# Patient Record
Sex: Female | Born: 1995
Health system: Southern US, Community
[De-identification: ages and names within clinical notes are randomized; demographics above are authoritative.]

## PROBLEM LIST (undated history)

## (undated) DIAGNOSIS — R011 Cardiac murmur, unspecified: Secondary | ICD-10-CM

## (undated) DIAGNOSIS — L709 Acne, unspecified: Secondary | ICD-10-CM

## (undated) DIAGNOSIS — D649 Anemia, unspecified: Secondary | ICD-10-CM

## (undated) HISTORY — PX: WISDOM TOOTH EXTRACTION: SHX21

## (undated) HISTORY — DX: Acne, unspecified: L70.9

## (undated) HISTORY — DX: Cardiac murmur, unspecified: R01.1

## (undated) HISTORY — PX: TONSILLECTOMY: SUR1361

---
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
## 2013-02-04 ENCOUNTER — Ambulatory Visit: Payer: Self-pay | Admitting: Obstetrics and Gynecology

## 2013-02-05 ENCOUNTER — Ambulatory Visit (INDEPENDENT_AMBULATORY_CARE_PROVIDER_SITE_OTHER): Payer: BC Managed Care – PPO | Admitting: Obstetrics & Gynecology

## 2013-02-05 ENCOUNTER — Encounter: Payer: Self-pay | Admitting: Obstetrics & Gynecology

## 2013-02-05 VITALS — BP 117/79 | HR 66 | Ht 67.0 in | Wt 123.0 lb

## 2013-02-05 DIAGNOSIS — Z113 Encounter for screening for infections with a predominantly sexual mode of transmission: Secondary | ICD-10-CM

## 2013-02-05 DIAGNOSIS — Z01419 Encounter for gynecological examination (general) (routine) without abnormal findings: Secondary | ICD-10-CM

## 2013-02-05 DIAGNOSIS — Z0000 Encounter for general adult medical examination without abnormal findings: Secondary | ICD-10-CM

## 2013-02-05 DIAGNOSIS — R011 Cardiac murmur, unspecified: Secondary | ICD-10-CM

## 2013-02-05 NOTE — Progress Notes (Signed)
Subjective:    Emily Flowers is a 17 y.o. female who presents for an annual exam. She needs her PE form filled out for [employer] where she will start in August.  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 The patient is not currently sexually active. She is no longer with this guy. GYN screening history: no prior history of gyn screening tests. The patient wears seatbelts: yes. The patient participates in regular exercise: yes. Has the patient ever been transfused or tattooed?: no. The patient reports that there is not domestic violence in her life.   Menstrual History: OB History   Grav Para Term Preterm Abortions TAB SAB Ect Mult Living                  Menarche age: 74  Patient's last menstrual period was 01/12/2013.    The following portions of the patient's history were reviewed and updated as appropriate: allergies, current medications, past family history, past medical history, past social history, past surgical history and problem list.  Review of Systems Pertinent items are noted in HPI.  Coitarche at 13, partners- 1. Condoms for birth control. Her mom does want her to take the OCPs for her acne. She is unsure why. She says that she had the Gardasil series at [facility].   Objective:    BP 117/79  Pulse 66  Ht 5\' 7"  (1.702 m)  Wt 123 lb (55.792 kg)  BMI 19.26 kg/m2  LMP 01/12/2013  General Appearance:    Alert, cooperative, no distress, appears stated age  Head:    Normocephalic, without obvious abnormality, atraumatic  Eyes:    PERRL, conjunctiva/corneas clear, EOM's intact, fundi    benign, both eyes  Ears:    Normal TM's and external ear canals, both ears  Nose:   Nares normal, septum midline, mucosa normal, no drainage    or sinus tenderness  Throat:   Lips, mucosa, and tongue normal; teeth and gums normal  Neck:   Supple, symmetrical, trachea midline, no adenopathy;    thyroid:  no enlargement/tenderness/nodules; no  carotid   bruit or JVD  Back:     Symmetric, no curvature, ROM normal, no CVA tenderness  Lungs:     Clear to auscultation bilaterally, respirations unlabored  Chest Wall:    No tenderness or deformity   Heart:    Regular rate and rhythm, S1 and S2 normal, 3/6 SEM (She does report occasions of extra fast heart rate/anxiety)  Breast Exam:    No tenderness, masses, or nipple abnormality  Abdomen:     Soft, non-tender, bowel sounds active all four quadrants,    no masses, no organomegaly  Genitalia:    Normal female without lesion, discharge or tenderness, shaved, bimanual deferred due to patient preference     Extremities:   Extremities normal, atraumatic, no cyanosis or edema  Pulses:   2+ and symmetric all extremities  Skin:   Skin color, texture, turgor normal, no rashes or lesions  Lymph nodes:   Cervical, supraclavicular, and axillary nodes normal  Neurologic:   CNII-XII intact, normal strength, sensation and reflexes    throughout  .    Assessment:    Healthy female exam.  New murmur- refer to Cards   Plan:     Breast self exam technique reviewed and patient encouraged to perform self-exam monthly.

## 2013-02-06 LAB — GC/CHLAMYDIA PROBE AMP
CT Probe RNA: NEGATIVE
GC Probe RNA: NEGATIVE

## 2013-04-10 ENCOUNTER — Institutional Professional Consult (permissible substitution): Payer: Self-pay | Admitting: Cardiovascular Disease

## 2013-04-17 ENCOUNTER — Encounter: Payer: Self-pay | Admitting: Cardiovascular Disease

## 2013-08-26 ENCOUNTER — Ambulatory Visit (INDEPENDENT_AMBULATORY_CARE_PROVIDER_SITE_OTHER): Payer: BC Managed Care – PPO | Admitting: *Deleted

## 2013-08-26 DIAGNOSIS — Z23 Encounter for immunization: Secondary | ICD-10-CM

## 2014-02-02 ENCOUNTER — Ambulatory Visit (INDEPENDENT_AMBULATORY_CARE_PROVIDER_SITE_OTHER): Payer: BC Managed Care – PPO | Admitting: Obstetrics & Gynecology

## 2014-02-02 ENCOUNTER — Encounter: Payer: Self-pay | Admitting: Obstetrics & Gynecology

## 2014-02-02 VITALS — BP 117/76 | HR 59 | Ht 66.0 in | Wt 132.0 lb

## 2014-02-02 DIAGNOSIS — Z309 Encounter for contraceptive management, unspecified: Secondary | ICD-10-CM

## 2014-02-02 DIAGNOSIS — IMO0001 Reserved for inherently not codable concepts without codable children: Secondary | ICD-10-CM

## 2014-02-02 DIAGNOSIS — N946 Dysmenorrhea, unspecified: Secondary | ICD-10-CM

## 2014-02-02 MED ORDER — LEVONORGESTREL-ETHINYL ESTRAD 0.1-20 MG-MCG PO TABS: 1.0000 | ORAL_TABLET | Freq: Every day | ORAL | Status: DC

## 2014-02-02 NOTE — Progress Notes (Signed)
   Subjective:    Patient ID: Emily Flowers, female    DOB: 1996-02-10, 18 y.o.   MRN: [id number]  HPI  18 yo AA G0 here with the desire to start OCPs for management of her acne and periods which are becoming heavier and more painful. She has been abstinent for the last 4 month.  Review of Systems She is a sophomore at [employer] She is on a scholarship for running at [employer]    Objective:   Physical Exam        Assessment & Plan:  Contraception/acne/menstrual regulation- start levlite Start today and use back up for a month prn Rec condoms at all times for STI protection

## 2014-08-16 ENCOUNTER — Ambulatory Visit (INDEPENDENT_AMBULATORY_CARE_PROVIDER_SITE_OTHER): Payer: BC Managed Care – PPO | Admitting: Obstetrics and Gynecology

## 2014-08-16 ENCOUNTER — Encounter: Payer: Self-pay | Admitting: Obstetrics and Gynecology

## 2014-08-16 VITALS — BP 129/82 | HR 70 | Wt 140.0 lb

## 2014-08-16 DIAGNOSIS — Z113 Encounter for screening for infections with a predominantly sexual mode of transmission: Secondary | ICD-10-CM

## 2014-08-16 DIAGNOSIS — N760 Acute vaginitis: Secondary | ICD-10-CM | POA: Diagnosis not present

## 2014-08-16 NOTE — Progress Notes (Signed)
Patient ID: Emily Flowers, female   DOB: October 17, 1995, 18 y.o.   MRN: [id number] 18 yo G0 here for the evaluation of a non-pruritic vaginal discharge which has been present for the past month. Patient reports being treated for a UTI a month ago. Patient is sexually active using condoms for STD prevention and OCP for contraception.  Past Medical History  Diagnosis Date  . Acne    History reviewed. No pertinent past surgical history. Family History  Problem Relation Age of Onset  . Diabetes Father   . Cancer Maternal Aunt     breast  . Diabetes Paternal Aunt   . Diabetes Paternal Uncle    History  Substance Use Topics  . Smoking status: Never Smoker   . Smokeless tobacco: Never Used  . Alcohol Use: No   GENERAL: Well-developed, well-nourished female in no acute distress.  ABDOMEN: Soft, nontender, nondistended. No organomegaly. PELVIC: Normal external female genitalia. Vagina is pink and rugated.  Normal discharge. Normal appearing cervix. Uterus is normal in size. No adnexal mass or tenderness. EXTREMITIES: No cyanosis, clubbing, or edema, 2+ distal pulses.  A/P 18 yo here for the evaluation of vaginitis - wet prep collected Gc/Cl cultures also collected - patient will be contacted with any abnormal results - rtc prn

## 2014-08-17 LAB — WET PREP, GENITAL
Clue Cells Wet Prep HPF POC: NONE SEEN
TRICH WET PREP: NONE SEEN
WBC WET PREP: NONE SEEN

## 2014-08-17 LAB — GC/CHLAMYDIA PROBE AMP
CT PROBE, AMP APTIMA: POSITIVE — AB
GC PROBE AMP APTIMA: NEGATIVE

## 2014-08-18 ENCOUNTER — Encounter: Payer: Self-pay | Admitting: *Deleted

## 2014-08-18 ENCOUNTER — Telehealth: Payer: Self-pay | Admitting: *Deleted

## 2014-08-18 DIAGNOSIS — A749 Chlamydial infection, unspecified: Secondary | ICD-10-CM

## 2014-08-18 DIAGNOSIS — B379 Candidiasis, unspecified: Secondary | ICD-10-CM

## 2014-08-18 MED ORDER — FLUCONAZOLE 150 MG PO TABS: 150.0000 mg | ORAL_TABLET | Freq: Once | ORAL | Status: DC

## 2014-08-18 MED ORDER — AZITHROMYCIN 1 G PO PACK: 1.0000 g | PACK | Freq: Once | ORAL | Status: DC

## 2014-08-18 NOTE — Telephone Encounter (Signed)
Patient is notified of test results and she will notify her partner and pick up her medication.

## 2014-08-18 NOTE — Telephone Encounter (Signed)
-----   Message from Mora Bellman, MD sent at 08/17/2014  2:03 PM EST ----- Please inform patient of positive yeast and chlamydia infection. Please call in prescription for Diflucan 150 mg po for 1 dose and Azithromycin 1000 mg po for 1 dose  Her partner should also be informed and treated for the chlamydia infection  Thanks  Peggy

## 2014-12-02 ENCOUNTER — Other Ambulatory Visit: Payer: Self-pay | Admitting: Obstetrics & Gynecology

## 2015-01-19 ENCOUNTER — Ambulatory Visit (INDEPENDENT_AMBULATORY_CARE_PROVIDER_SITE_OTHER): Payer: Self-pay | Admitting: Obstetrics & Gynecology

## 2015-01-19 ENCOUNTER — Encounter: Payer: Self-pay | Admitting: Obstetrics & Gynecology

## 2015-01-19 VITALS — BP 125/86 | HR 71 | Ht 66.0 in | Wt 137.8 lb

## 2015-01-19 DIAGNOSIS — Z30013 Encounter for initial prescription of injectable contraceptive: Secondary | ICD-10-CM

## 2015-01-19 DIAGNOSIS — N898 Other specified noninflammatory disorders of vagina: Secondary | ICD-10-CM

## 2015-01-19 MED ORDER — MISOPROSTOL 200 MCG PO TABS: ORAL_TABLET | ORAL | Status: DC

## 2015-01-19 MED ORDER — FLUCONAZOLE 150 MG PO TABS: 150.0000 mg | ORAL_TABLET | Freq: Once | ORAL | Status: DC

## 2015-01-19 MED ORDER — MEDROXYPROGESTERONE ACETATE 150 MG/ML IM SUSP
150.0000 mg | Freq: Once | INTRAMUSCULAR | Status: AC
  Administered 2015-01-19: 150 mg via INTRAMUSCULAR

## 2015-01-19 NOTE — Progress Notes (Signed)
Patient took two courses of antibiotic and now she feels vaginal irritation.  She is also wanting to discuss changing birth control as she is having trouble remembering to take the pill every day.

## 2015-01-19 NOTE — Progress Notes (Signed)
   Subjective:    Patient ID: Emily Flowers, female    DOB: 10/07/1995, 19 y.o.   MRN: [id number]  HPI  19 yo S AA G0 is here with the complaint of a month h/o vaginal discharge and vaginal itching. She thinks that this started after taking a zpack for a sinus infection last month. Of note, she and boyfriend were treated for chlamydia 12/15. No TOC yet.  She is forgetting to take her OCPs and would like to try Skyla   Review of Systems She is a Furniture conservator/restorer at [employer]. She says that she has had all 3 Gardasil shots.    Objective:   Physical Exam [id number] abd- benign EG-normal Vaginal with some thickish white discharge, possibly yeast. Otherwise all is normal.       Assessment & Plan:  Contraception- depo provera today and RTC when we have Skyla in stock (cytotec the night before insertion) Vaginal itch- check cervical cultures and wet prep. Treat with diflucan now.

## 2015-01-20 LAB — WET PREP, GENITAL
Clue Cells Wet Prep HPF POC: NONE SEEN
Trich, Wet Prep: NONE SEEN

## 2015-01-21 LAB — GC/CHLAMYDIA PROBE AMP
CT PROBE, AMP APTIMA: NEGATIVE
GC PROBE AMP APTIMA: NEGATIVE

## 2015-03-30 ENCOUNTER — Emergency Department (INDEPENDENT_AMBULATORY_CARE_PROVIDER_SITE_OTHER): Payer: BLUE CROSS/BLUE SHIELD

## 2015-03-30 ENCOUNTER — Encounter (HOSPITAL_COMMUNITY): Payer: Self-pay | Admitting: *Deleted

## 2015-03-30 ENCOUNTER — Emergency Department (INDEPENDENT_AMBULATORY_CARE_PROVIDER_SITE_OTHER)
Admission: EM | Admit: 2015-03-30 | Discharge: 2015-03-30 | Disposition: A | Discharge status: Home / Self Care | Payer: BLUE CROSS/BLUE SHIELD | Source: Home / Self Care | Attending: Family Medicine | Admitting: Family Medicine

## 2015-03-30 DIAGNOSIS — J069 Acute upper respiratory infection, unspecified: Secondary | ICD-10-CM | POA: Diagnosis not present

## 2015-03-30 MED ORDER — IPRATROPIUM BROMIDE 0.06 % NA SOLN: 2.0000 | Freq: Four times a day (QID) | NASAL | Status: DC

## 2015-03-30 NOTE — ED Provider Notes (Addendum)
CSN: [id number]     Arrival date & time 03/30/15  1300 History   First MD Initiated Contact with Patient 03/30/15 1338     Chief Complaint  Patient presents with  . Chest Pain   (Consider location/radiation/quality/duration/timing/severity/associated sxs/prior Treatment) Patient is a 19 y.o. female presenting with URI. The history is provided by the patient.  URI Presenting symptoms: congestion, cough and rhinorrhea   Presenting symptoms: no fever and no sore throat   Severity:  Mild Onset quality:  Gradual Duration:  1 week Progression:  Unchanged Chronicity:  New Ineffective treatments:  Prescription medications Associated symptoms: no sneezing and no swollen glands   Risk factors: no sick contacts     Past Medical History  Diagnosis Date  . Acne    History reviewed. No pertinent past surgical history. Family History  Problem Relation Age of Onset  . Diabetes Father   . Cancer Maternal Aunt     breast  . Diabetes Paternal Aunt   . Diabetes Paternal Uncle    History  Substance Use Topics  . Smoking status: Never Smoker   . Smokeless tobacco: Never Used  . Alcohol Use: No   OB History    No data available     Review of Systems  Constitutional: Negative.  Negative for fever.  HENT: Positive for congestion, postnasal drip and rhinorrhea. Negative for sneezing and sore throat.   Respiratory: Positive for cough.   Cardiovascular: Negative.   Gastrointestinal: Negative.   Skin: Negative.     Allergies  Banana; Penicillins; and Sulfa antibiotics  Home Medications   Prior to Admission medications   Medication Sig Start Date End Date Taking? Authorizing Provider  cetirizine (ZYRTEC) 10 MG tablet Take 10 mg by mouth daily.    Historical Provider, MD  fluconazole (DIFLUCAN) 150 MG tablet Take 1 tablet (150 mg total) by mouth once. 01/19/15   Emily Filbert, MD  ipratropium (ATROVENT) 0.06 % nasal spray Place 2 sprays into both nostrils 4 (four) times daily. 03/30/15    Billy Fischer, MD  misoprostol (CYTOTEC) 200 MCG tablet Take 3 pills by mouth the night before biopsy. 01/19/15   Emily Filbert, MD  Multiple Vitamins-Minerals (MULTIVITAMIN WITH MINERALS) tablet Take 1 tablet by mouth daily.    Historical Provider, MD  ORSYTHIA 0.1-20 MG-MCG tablet TAKE 1 TABLET BY MOUTH DAILY. 12/06/14   Emily Filbert, MD   BP 123/84 mmHg  Pulse 82  Temp(Src) 98.5 F (36.9 C) (Oral)  Resp 16  SpO2 97% Physical Exam  Constitutional: She is oriented to person, place, and time. She appears well-developed and well-nourished. No distress.  HENT:  Head: Normocephalic.  Right Ear: External ear normal.  Left Ear: External ear normal.  Mouth/Throat: Oropharynx is clear and moist.  Eyes: Conjunctivae are normal. Pupils are equal, round, and reactive to light.  Neck: Normal range of motion. Neck supple.  Cardiovascular: Normal heart sounds.   Pulmonary/Chest: Breath sounds normal.  Lymphadenopathy:    She has no cervical adenopathy.  Neurological: She is alert and oriented to person, place, and time.  Skin: Skin is warm and dry.  Nursing note and vitals reviewed.   ED Course  Procedures (including critical care time) Labs Review Labs Reviewed - No data to display  Imaging Review Dg Chest 2 View  03/30/2015   CLINICAL DATA:  Two day history of chest pain  EXAM: CHEST  2 VIEW  COMPARISON:  None.  FINDINGS: Lungs are clear. Heart size and  pulmonary vascularity are normal. No adenopathy. No pneumothorax. No bone lesions.  IMPRESSION: No abnormality noted.   Electronically Signed   By: Lowella Grip III M.D.   On: 03/30/2015 14:13   X-rays reviewed and report per radiologist.   MDM   1. URI (upper respiratory infection)   pt stated that mother requesting cxr to make sure, after pt was d/c'd. cxr neg.     Billy Fischer, MD 03/30/15 1347  Billy Fischer, MD 03/30/15 [phone]

## 2015-03-30 NOTE — ED Notes (Signed)
Pt  Reports        Chest  Pain  And     Upper  Back  Pain   Worse   When  She  Takes a  Deep breath      Symptoms  Began  Yesterday         denys  Any  specefic injury           ambualted  To  Room  With a  Steady fluid  Gait

## 2015-03-30 NOTE — Discharge Instructions (Signed)
Drink plenty of fluids as discussed, use medicine as prescribed, and mucinex or delsym for cough. Return or see your doctor if further problems °

## 2015-03-31 ENCOUNTER — Other Ambulatory Visit: Payer: Self-pay

## 2015-04-05 ENCOUNTER — Ambulatory Visit (INDEPENDENT_AMBULATORY_CARE_PROVIDER_SITE_OTHER): Payer: BLUE CROSS/BLUE SHIELD | Admitting: Family Medicine

## 2015-04-05 ENCOUNTER — Encounter: Payer: Self-pay | Admitting: Family Medicine

## 2015-04-05 VITALS — BP 90/60 | HR 80 | Temp 98.6°F | Resp 18 | Wt 129.0 lb

## 2015-04-05 DIAGNOSIS — R634 Abnormal weight loss: Secondary | ICD-10-CM | POA: Diagnosis not present

## 2015-04-05 DIAGNOSIS — R5383 Other fatigue: Secondary | ICD-10-CM | POA: Diagnosis not present

## 2015-04-05 DIAGNOSIS — J302 Other seasonal allergic rhinitis: Secondary | ICD-10-CM

## 2015-04-05 DIAGNOSIS — J069 Acute upper respiratory infection, unspecified: Secondary | ICD-10-CM | POA: Diagnosis not present

## 2015-04-05 NOTE — Progress Notes (Signed)
Patient: Emily Flowers Female    DOB: 1996-08-20   19 y.o.   MRN: [id number] Visit Date: 04/05/2015  Today's Provider: Vernie Murders, PA   Chief Complaint  Patient presents with  . Establish Care  . Sinusitis   Subjective:    Sinusitis This is a recurrent problem. The current episode started in the past 7 days. The problem has been gradually improving since onset. There has been no fever. Her pain is at a severity of 4/10. The pain is mild. Associated symptoms include congestion, coughing (on and off), a hoarse voice, sinus pressure, sneezing and swollen glands. Pertinent negatives include no ear pain, headaches or shortness of breath. (Patient feels throat is scratchy) Past treatments include nothing.  Patient was seen at the [facility] and was diagnosed with URI and also had an X-ray done due to patient complaining of chest pain. X-ray were Normal. Patient discharge instructions were to drink plenty of fluids and use medicine as prescribed, and Mucinex or Delsym for cough. Per patient have not pick up medicines because wanted to come to this appointment first.    Past Medical History  Diagnosis Date  . Acne    History reviewed. No pertinent past surgical history.   Family History  Problem Relation Age of Onset  . Diabetes Father   . Cancer Maternal Aunt     breast  . Diabetes Paternal Aunt   . Diabetes Paternal Uncle    Allergies  Allergen Reactions  . Banana Swelling    Throat swells  . Penicillins Hives  . Sulfa Antibiotics    Previous Medications   CETIRIZINE (ZYRTEC) 10 MG TABLET    Take 10 mg by mouth daily.   FLUTICASONE (FLONASE) 50 MCG/ACT NASAL SPRAY       IPRATROPIUM (ATROVENT) 0.06 % NASAL SPRAY    Place 2 sprays into both nostrils 4 (four) times daily.   MULTIPLE VITAMINS-MINERALS (MULTIVITAMIN WITH MINERALS) TABLET    Take 1 tablet by mouth daily.    Review of Systems  Constitutional: Positive for appetite change.  HENT: Positive  for congestion, hoarse voice, sinus pressure, sneezing and voice change. Negative for ear pain.   Eyes: Negative.   Respiratory: Positive for cough (on and off). Negative for shortness of breath.   Cardiovascular: Negative.   Gastrointestinal: Negative.   Endocrine: Negative.   Genitourinary: Negative.   Musculoskeletal: Negative.   Skin: Negative.   Allergic/Immunologic: Positive for environmental allergies.  Neurological: Negative.  Negative for headaches.  Hematological: Negative.   Psychiatric/Behavioral: Negative.     History  Substance Use Topics  . Smoking status: Never Smoker   . Smokeless tobacco: Never Used  . Alcohol Use: No   Objective:   BP 90/60 mmHg  Pulse 80  Temp(Src) 98.6 F (37 C) (Oral)  Resp 18  Wt 129 lb (58.514 kg)  Physical Exam  Constitutional: She is oriented to person, place, and time. She appears well-developed and well-nourished.  HENT:  Head: Atraumatic.  Right Ear: External ear normal.  Left Ear: External ear normal.  Minimal cobblestoning of posterior pharynx without exudates or significant redness.  Eyes: Conjunctivae and EOM are normal. Pupils are equal, round, and reactive to light.  Neck: Normal range of motion. Neck supple.  Cardiovascular: Normal rate, regular rhythm and normal heart sounds.   Pulmonary/Chest: Effort normal and breath sounds normal.  Abdominal: Soft. Bowel sounds are normal.  Musculoskeletal: Normal range of motion.  Neurological: She  is oriented to person, place, and time.  Skin: No rash noted.  Psychiatric: She has a normal mood and affect. Her behavior is normal. Thought content normal.      Assessment & Plan:     1. Other seasonal allergic rhinitis Seasonal rhinitis spring and fall. Recommend she proceed with the Zyrtec and Flonase nasal spray as recommended by the Urgent Care. May need to continue nose spray throughout the season.  2. Upper respiratory infection No fever just some cough and congestion  over the past 7 days. May use Mucinex and Delsym prn. Increase fluid intake and recheck prn.  3. Weight loss, unintentional Worried about weight loss of 11 lbs since May 2016 (was 140 lbs - now 129 lbs). - TSH - COMPLETE METABOLIC PANEL WITH GFR - CBC with Differential/Platelet - Iron  4. Other fatigue Some past anemia. Has an innocent murmur (not heard today) that was evaluated with echocardiogram in 2014 (no pathologic findings). Had her first Depo-Provera injection 2 months ago by GYN. Frequent daily slight flow since starting the Depo-Provera. Will check labs and iron level. - TSH - COMPLETE METABOLIC PANEL WITH GFR - CBC with Differential/Platelet - Iron       Vernie Murders, PA  [facility]

## 2015-04-06 LAB — CBC WITH DIFFERENTIAL/PLATELET
BASOS ABS: 0 10*3/uL (ref 0.0–0.2)
Basos: 1 %
EOS (ABSOLUTE): 0.2 10*3/uL (ref 0.0–0.4)
Eos: 5 %
HEMATOCRIT: 40.8 % (ref 34.0–46.6)
Hemoglobin: 13.6 g/dL (ref 11.1–15.9)
Immature Grans (Abs): 0 10*3/uL (ref 0.0–0.1)
Immature Granulocytes: 0 %
LYMPHS: 65 %
Lymphocytes Absolute: 2.5 10*3/uL (ref 0.7–3.1)
MCH: 30.5 pg (ref 26.6–33.0)
MCHC: 33.3 g/dL (ref 31.5–35.7)
MCV: 92 fL (ref 79–97)
MONOS ABS: 0.3 10*3/uL (ref 0.1–0.9)
Monocytes: 9 %
Neutrophils Absolute: 0.8 10*3/uL — ABNORMAL LOW (ref 1.4–7.0)
Neutrophils: 20 %
Platelets: 201 10*3/uL (ref 150–379)
RBC: 4.46 x10E6/uL (ref 3.77–5.28)
RDW: 13.4 % (ref 12.3–15.4)
WBC: 3.8 10*3/uL (ref 3.4–10.8)

## 2015-04-06 LAB — COMPREHENSIVE METABOLIC PANEL
ALK PHOS: 41 IU/L (ref 39–117)
ALT: 27 IU/L (ref 0–32)
AST: 21 IU/L (ref 0–40)
Albumin/Globulin Ratio: 1.8 (ref 1.1–2.5)
Albumin: 4.8 g/dL (ref 3.5–5.5)
BILIRUBIN TOTAL: 0.7 mg/dL (ref 0.0–1.2)
BUN / CREAT RATIO: 11 (ref 8–20)
BUN: 11 mg/dL (ref 6–20)
CO2: 21 mmol/L (ref 18–29)
CREATININE: 1.03 mg/dL — AB (ref 0.57–1.00)
Calcium: 10.1 mg/dL (ref 8.7–10.2)
Chloride: 104 mmol/L (ref 97–108)
GFR calc Af Amer: 90 mL/min/{1.73_m2} (ref >59)
GFR, EST NON AFRICAN AMERICAN: 78 mL/min/{1.73_m2} (ref >59)
GLOBULIN, TOTAL: 2.7 g/dL (ref 1.5–4.5)
Glucose: 82 mg/dL (ref 65–99)
Potassium: 4.8 mmol/L (ref 3.5–5.2)
Sodium: 142 mmol/L (ref 134–144)
Total Protein: 7.5 g/dL (ref 6.0–8.5)

## 2015-04-06 LAB — IRON: Iron: 107 ug/dL (ref 27–159)

## 2015-04-06 LAB — TSH: TSH: 0.808 u[IU]/mL (ref 0.450–4.500)

## 2015-04-07 ENCOUNTER — Telehealth: Payer: Self-pay | Admitting: Family Medicine

## 2015-04-11 ENCOUNTER — Telehealth: Payer: Self-pay

## 2015-04-11 NOTE — Telephone Encounter (Signed)
Patient called, regarding labs results. Patient aware of labs results:(All blood tests essentially normal with only mild variations on normal. Recommend antihistamine for allergy symptoms and multivitamin daily to help maintain health and appetite. Recheck annually.) Emily Flowers Informed.  Thanks,  -Joseline

## 2015-04-11 NOTE — Telephone Encounter (Signed)
Left message to call back.  Thanks, 

## 2015-04-11 NOTE — Telephone Encounter (Signed)
Pt mom, Maudie Mercury is returning call.  CB#[phone] or [phone]

## 2015-04-11 NOTE — Telephone Encounter (Signed)
-----   Message from [place], [place] sent at 04/08/2015  6:45 PM EDT ----- All blood tests essentially normal with only mild variations on normal. Recommend antihistamine for allergy symptoms and multivitamin daily to help maintain health and appetite. Recheck annually.

## 2015-04-11 NOTE — Telephone Encounter (Signed)
Advised pt as directed below. Pt verbalized fully understanding.  Thanks,

## 2015-04-11 NOTE — Telephone Encounter (Signed)
Advised pt's mother as directed on previus note to advise pt of lab results. Pt's mother verbalized fully understanding.  Thanks,

## 2015-04-15 ENCOUNTER — Ambulatory Visit: Payer: Self-pay

## 2015-07-20 ENCOUNTER — Encounter: Payer: Self-pay | Admitting: Advanced Practice Midwife

## 2015-07-20 ENCOUNTER — Ambulatory Visit (INDEPENDENT_AMBULATORY_CARE_PROVIDER_SITE_OTHER): Payer: BLUE CROSS/BLUE SHIELD | Admitting: Advanced Practice Midwife

## 2015-07-20 VITALS — BP 108/67 | HR 63 | Ht 66.0 in | Wt 103.0 lb

## 2015-07-20 DIAGNOSIS — Z3009 Encounter for other general counseling and advice on contraception: Secondary | ICD-10-CM

## 2015-07-20 DIAGNOSIS — Z3041 Encounter for surveillance of contraceptive pills: Secondary | ICD-10-CM

## 2015-07-20 DIAGNOSIS — Z01812 Encounter for preprocedural laboratory examination: Secondary | ICD-10-CM | POA: Diagnosis not present

## 2015-07-20 LAB — POCT URINE PREGNANCY: PREG TEST UR: NEGATIVE

## 2015-07-20 MED ORDER — LEVONORGESTREL-ETHINYL ESTRAD 0.1-20 MG-MCG PO TABS: 1.0000 | ORAL_TABLET | Freq: Every day | ORAL | Status: DC

## 2015-07-20 NOTE — Progress Notes (Signed)
Here today, wants to switch back to Orsythia oral BC. Has had irregular bleeding since she last received her depo provera.  Has not had intercourse at all since receiving her last depo shot.

## 2015-07-20 NOTE — Progress Notes (Signed)
Subjective:     Patient ID: Emily Flowers, female   DOB: 1995-12-23, 19 y.o.   MRN: [id number]  HPI Pt presents with desire to switch back to oral contraceptives from Depo Provera. She has had irregular bleeding since her last injection of Depo and is unhappy with this. She previously took [place] pills and they worked well for her.  She denies personal or family hx of blood clots.  She denies migraine h/as or hx of HTN.  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 She denies vaginal bleeding, vaginal itching/burning, urinary symptoms, h/a, heart palpitations, heat/cold intolerance, dizziness, n/v, or fever/chills.    Review of Systems  Constitutional: Negative for fever, chills and fatigue.  HENT: Negative for sinus pressure.   Eyes: Negative for photophobia.  Respiratory: Negative for shortness of breath.   Cardiovascular: Negative for chest pain.  Gastrointestinal: Negative for nausea, vomiting, diarrhea and constipation.  Genitourinary: Negative for dysuria, frequency, flank pain, vaginal bleeding, vaginal discharge, difficulty urinating, vaginal pain and pelvic pain.  Musculoskeletal: Negative for neck pain.  Neurological: Negative for dizziness, weakness and headaches.  Psychiatric/Behavioral: Negative.        Objective:   Physical Exam VS reviewed, nursing note reviewed,  Constitutional: well developed, well nourished, no distress HEENT: normocephalic CV: normal rate Pulm/chest wall: normal effort Abdomen: soft Neuro: alert and oriented x 3 Skin: warm, dry Psych: affect normal Pelvic exam: Deferred  Results for orders placed or performed in visit on 07/20/15 (from the past 24 hour(s))  POCT urine pregnancy     Status: Normal   Collection Time: 07/20/15  3:41 PM  Result Value Ref Range   Preg Test, Ur Negative Negative      Assessment:     1. Encounter for preprocedural laboratory examination  - POCT urine pregnancy  2. Counseling for birth  control, oral contraceptives --Orsythia generic Rx with 11 refills sent to pharmacy      Plan:     Rx for OCPs Discussed weight loss, pt should follow up with PCP if unexplained weight loss F/U in 1 year for annual exam or PRN

## 2015-08-10 ENCOUNTER — Ambulatory Visit: Payer: BLUE CROSS/BLUE SHIELD | Admitting: Physician Assistant

## 2015-08-10 ENCOUNTER — Ambulatory Visit (INDEPENDENT_AMBULATORY_CARE_PROVIDER_SITE_OTHER): Payer: BLUE CROSS/BLUE SHIELD | Admitting: Physician Assistant

## 2015-08-10 ENCOUNTER — Encounter: Payer: Self-pay | Admitting: Physician Assistant

## 2015-08-10 VITALS — BP 108/70 | HR 70 | Temp 98.8°F | Resp 16 | Wt 137.6 lb

## 2015-08-10 DIAGNOSIS — J0140 Acute pansinusitis, unspecified: Secondary | ICD-10-CM

## 2015-08-10 DIAGNOSIS — B379 Candidiasis, unspecified: Secondary | ICD-10-CM | POA: Diagnosis not present

## 2015-08-10 DIAGNOSIS — T3695XA Adverse effect of unspecified systemic antibiotic, initial encounter: Secondary | ICD-10-CM

## 2015-08-10 MED ORDER — AZITHROMYCIN 250 MG PO TABS: ORAL_TABLET | ORAL | Status: DC

## 2015-08-10 MED ORDER — MAGIC MOUTHWASH W/LIDOCAINE: 5.0000 mL | Freq: Three times a day (TID) | ORAL | Status: DC | PRN

## 2015-08-10 MED ORDER — FLUCONAZOLE 150 MG PO TABS: 150.0000 mg | ORAL_TABLET | Freq: Once | ORAL | Status: DC

## 2015-08-10 NOTE — Progress Notes (Signed)
Patient: Emily Flowers Female    DOB: [birth year]   19 y.o.   MRN: [id number] Visit Date: 08/10/2015  Today's Provider: Mar Daring, PA-C   Chief Complaint  Patient presents with  . Sinusitis  . Sore Throat   Subjective:    Sinusitis This is a new problem. The current episode started in the past 7 days. The problem has been gradually worsening since onset. There has been no fever. Associated symptoms include congestion, coughing, ear pain, headaches, a hoarse voice, sinus pressure, sneezing, a sore throat and swollen glands. Pertinent negatives include no chills or shortness of breath. Treatments tried: Zyrtec. The treatment provided no relief.  Sore Throat  This is a new problem. The current episode started today. There has been no fever. Associated symptoms include congestion, coughing, ear pain, headaches, a hoarse voice, a plugged ear sensation and swollen glands. Pertinent negatives include no shortness of breath, trouble swallowing or vomiting. She has tried nothing for the symptoms. The treatment provided no relief.   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 She states that a lot of the children have come in with runny noses, cough and cold symptoms. She has not tried any other treatment other than the Zyrtec at this time.    Allergies  Allergen Reactions  . Banana Swelling    Throat swells  . Penicillins Hives  . Sulfa Antibiotics    Previous Medications   CETIRIZINE (ZYRTEC) 10 MG TABLET    Take 10 mg by mouth daily. Reported on 08/10/2015   LEVONORGESTREL-ETHINYL ESTRADIOL (AVIANE,ALESSE,LESSINA) 0.1-20 MG-MCG TABLET    Take 1 tablet by mouth daily.   MULTIPLE VITAMINS-MINERALS (MULTIVITAMIN WITH MINERALS) TABLET    Take 1 tablet by mouth daily.    Review of Systems  Constitutional: Positive for fatigue. Negative for fever and chills.  HENT: Positive for congestion, ear pain, hoarse voice, postnasal drip, rhinorrhea, sinus  pressure, sneezing and sore throat. Negative for trouble swallowing.   Eyes: Negative.   Respiratory: Positive for cough. Negative for chest tightness, shortness of breath and wheezing.   Cardiovascular: Negative.   Gastrointestinal: Negative for nausea and vomiting.  Neurological: Positive for headaches. Negative for dizziness.  All other systems reviewed and are negative.   Social History  Substance Use Topics  . Smoking status: Never Smoker   . Smokeless tobacco: Never Used  . Alcohol Use: No   Objective:   BP 108/70 mmHg  Pulse 70  Temp(Src) 98.8 F (37.1 C) (Oral)  Resp 16  Wt 137 lb 9.6 oz (62.415 kg)  SpO2 98%  LMP 08/04/2015  Physical Exam  Constitutional: She appears well-developed and well-nourished. No distress.  HENT:  Head: Normocephalic and atraumatic.  Right Ear: Hearing, external ear and ear canal normal. Tympanic membrane is not erythematous, not retracted and not bulging. A middle ear effusion is present.  Left Ear: Hearing, external ear and ear canal normal. Tympanic membrane is not erythematous, not retracted and not bulging. A middle ear effusion is present.  Nose: Mucosal edema and rhinorrhea present. Right sinus exhibits maxillary sinus tenderness and frontal sinus tenderness. Left sinus exhibits maxillary sinus tenderness and frontal sinus tenderness.  Mouth/Throat: Uvula is midline and mucous membranes are normal. Posterior oropharyngeal edema and posterior oropharyngeal erythema present. No oropharyngeal exudate.  Neck: Normal range of motion. Neck supple. No tracheal deviation present. No thyromegaly present.  Cardiovascular: Normal rate, regular rhythm and normal heart sounds.  Exam  reveals no gallop and no friction rub.   No murmur heard. Pulmonary/Chest: Effort normal and breath sounds normal. No stridor. No respiratory distress. She has no wheezes. She has no rales.  Lymphadenopathy:    She has no cervical adenopathy.  Skin: She is not  diaphoretic.  Vitals reviewed.       Assessment & Plan:     1. Acute pansinusitis, recurrence not specified Worsening 1 week. 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 I did advise her to make sure to be staying well-hydrated and to tried to get rest. I will also give her Magic mouthwash with lidocaine for the sore throat. She is to call the office if symptoms worsen or fail to improve within 7-10 days.  2. Antibiotic-induced yeast infection  given as prophylaxis the that she may have on hand. She states that anytime she takes an antibiotic she does normally get a yeast infection. I did advise her that she could take the antibiotic with yogurt to see if this may help prevent yeast infection as well. I did give Diflucan as below if she is to develop a yeast infection so she would have treatment on hand. Is to call the office if she has any worsening symptoms or failure to improve. - fluconazole (DIFLUCAN) 150 MG tablet; Take 1 tablet (150 mg total) by mouth once. Repeat in 24-48 hours if necessary  Dispense: 2 tablet; Refill: 0       Mar Daring, PA-C  [facility]

## 2015-08-10 NOTE — Patient Instructions (Signed)

## 2015-08-26 ENCOUNTER — Telehealth: Payer: Self-pay | Admitting: Family Medicine

## 2015-08-26 DIAGNOSIS — J0100 Acute maxillary sinusitis, unspecified: Secondary | ICD-10-CM

## 2015-08-26 MED ORDER — AZITHROMYCIN 250 MG PO TABS: ORAL_TABLET | ORAL | Status: DC

## 2015-08-26 NOTE — Telephone Encounter (Signed)
Please notify patient refill sent to [facility] [address]

## 2015-08-26 NOTE — Telephone Encounter (Signed)
Patient advised [facility].

## 2015-08-26 NOTE — Telephone Encounter (Signed)
Pt called in saying she was having sinus symptoms still.  She said you told her to call back for a refill on the zpak if she wasn't better.  [facility].  Her call back is [phone]  Thanks Con Memos

## 2016-01-10 ENCOUNTER — Telehealth: Payer: Self-pay | Admitting: *Deleted

## 2016-01-10 DIAGNOSIS — Z3041 Encounter for surveillance of contraceptive pills: Secondary | ICD-10-CM

## 2016-01-10 MED ORDER — LEVONORGESTREL-ETHINYL ESTRAD 0.1-20 MG-MCG PO TABS: 1.0000 | ORAL_TABLET | Freq: Every day | ORAL | Status: DC

## 2016-01-10 NOTE — Telephone Encounter (Signed)
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  Sent existing Rx to new pharmacy.

## 2016-03-09 ENCOUNTER — Telehealth: Payer: Self-pay | Admitting: *Deleted

## 2016-03-09 DIAGNOSIS — Z3041 Encounter for surveillance of contraceptive pills: Secondary | ICD-10-CM

## 2016-03-09 MED ORDER — LEVONORGESTREL-ETHINYL ESTRAD 0.1-20 MG-MCG PO TABS: 1.0000 | ORAL_TABLET | Freq: Every day | ORAL | Status: DC

## 2016-03-09 NOTE — Telephone Encounter (Signed)
[facility] to be sent in.  Sent rx via e-prescribed to pharmacy.

## 2016-11-16 IMAGING — DX DG CHEST 2V
2 series · 2 of 2 positions shown · non-contrast
Comparison: None.

CLINICAL DATA: Two day history of chest pain

EXAM:
CHEST  2 VIEW

[chest pa]
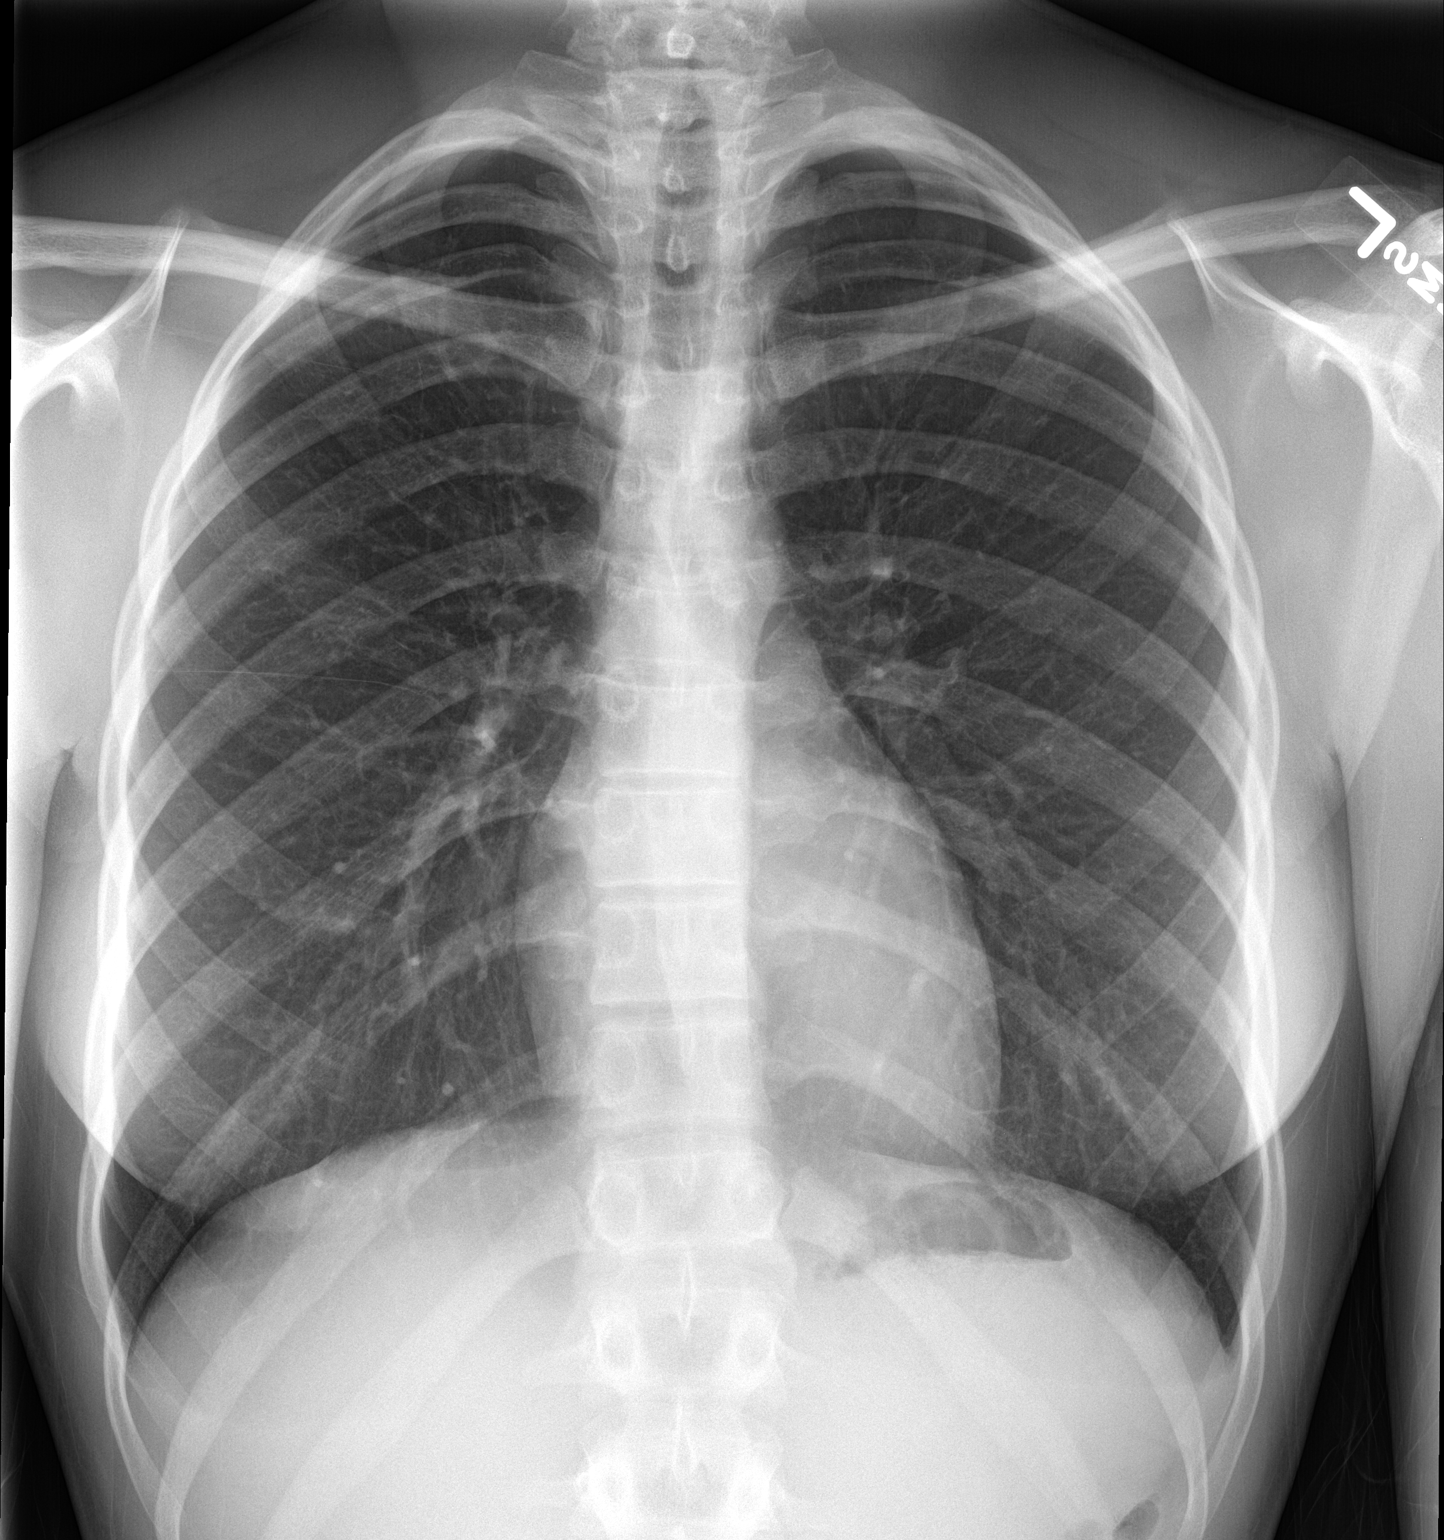

[chest lat]
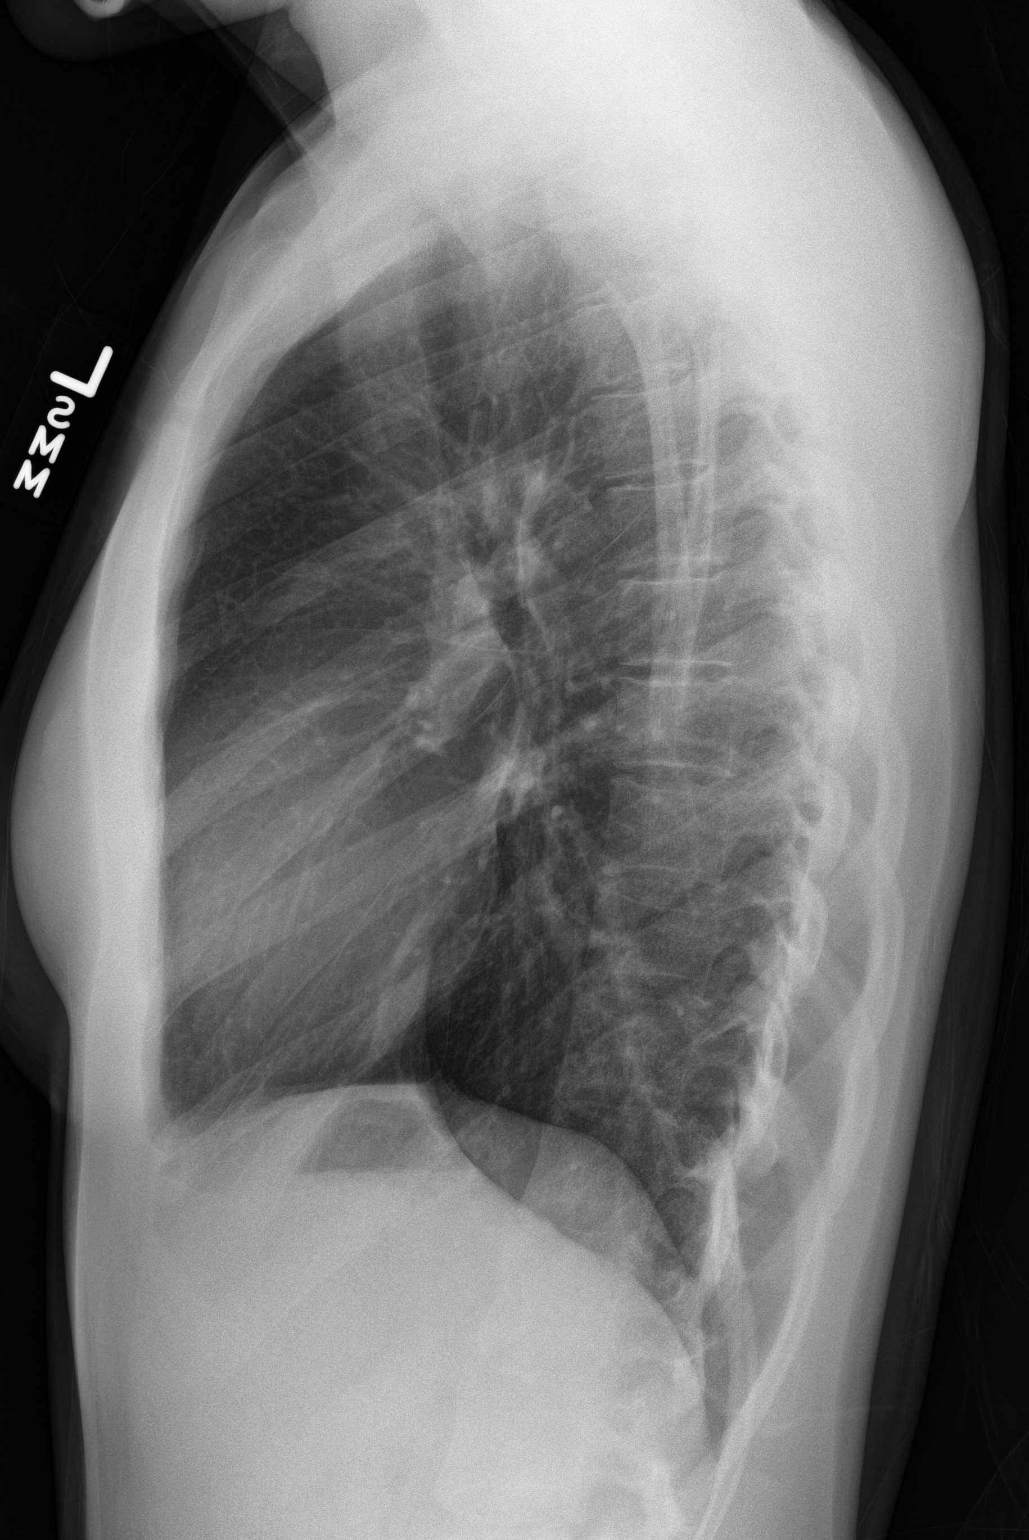

[2 of 2 positions shown; findings below may reference images not displayed]

FINDINGS: Lungs are clear. Heart size and pulmonary vascularity are normal. No
adenopathy. No pneumothorax. No bone lesions.
IMPRESSION: No abnormality noted.

## 2017-05-28 ENCOUNTER — Encounter: Payer: Self-pay | Admitting: Speech Pathology

## 2017-05-28 ENCOUNTER — Ambulatory Visit: Payer: BLUE CROSS/BLUE SHIELD | Attending: Unknown Physician Specialty | Admitting: Speech Pathology

## 2017-05-28 DIAGNOSIS — R490 Dysphonia: Secondary | ICD-10-CM | POA: Insufficient documentation

## 2017-05-28 NOTE — Therapy (Signed)
[place] MAIN [facility] SERVICES [address] [place], [place], [address] Phone: [phone]   Fax:  [phone]  Speech Language Pathology Evaluation  Patient Details  Name: Emily Flowers MRN: [id number] Date of Birth: 03-11-1996 Referring Provider: Dr. Tami Ribas  Encounter Date: 05/28/2017      End of Session - 05/28/17 1254    Visit Number 1   Number of Visits 9   Date for SLP Re-Evaluation 07/26/17   SLP Start Time 0910   SLP Stop Time  0950   SLP Time Calculation (min) 40 min   Activity Tolerance Patient tolerated treatment well      Past Medical History:  Diagnosis Date  . Acne     History reviewed. No pertinent surgical history.  There were no vitals filed for this visit.      Subjective Assessment - 05/28/17 1252    Subjective Pt was pleasant and agreeable to evaluation.   Currently in Pain? No/denies            SLP Evaluation [facility] - 05/28/17 0001      SLP Visit Information   SLP Received On 05/28/17   Referring Provider Dr. Tami Ribas   Onset Date 05/07/17   Medical Diagnosis Hoarseness/vocal cord nodules     Subjective   Subjective Pt was pleasant and agreeable to evaluation   Patient/Family Stated Goal Pt stated she would, "like to get her vocal range back, her projection, and would like to talk without straining."     General Information   HPI Pt is a 21 y/o female whom comes in with a diagnosis of bilateral vocal cord nodules after being assessed by ENT for hoarsenss.    Behavioral/Cognition WFL   Mobility Status Ambulatory     Prior Functional Status   Cognitive/Linguistic Baseline Within functional limits   Education College   Vocation Part time employment     Oral Motor/Sensory Function   Overall Oral Motor/Sensory Function Appears within functional limits for tasks assessed     Motor Speech   Overall Motor Speech Impaired   Respiration --  Observed decreased diaphragmatic breath   Phonation  Hoarse   Resonance Within functional limits   Phonation Impaired   Vocal Abuses Prolonged Vocal Use;Habitual Cough/Throat Clear;Other (comment)  Bilateral vocal cord nodules   Volume Appropriate   Pitch --  Observed breathing, hoarse vocal quality     Perceptual Voice Evaluation Voice Checklist:  Health Risks: Allergies, Indigestion (sometimes), sinus problems  Characteristic voice use: 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  Vocal characteristics: Pt reports vocal fatigue, limited pitch range, inability to project, changes in usual pitch and changes in usual quality (breathy, hoarse and strained) Average fundamental frequency during sustained "ah": 248 Hz (Norm 243) Average time patient was able to sustain /s/: 13 seconds Average time patient was able to sustain /z/: 8 seconds S/z ratio: 1.625 Visi-pitch: Multi-Dimensional Voice Program (MDVP) MDVP extracts objective quantitative values (Relative Average Perturbation, Shimmer, Voice Turbulence Index, and Noise to Harmonic Ratio) on sustained phonation, which are displayed graphically and numerically in comparison to a built in IT sales professional. The patient exhibited values outside the norm for Shimmer and relative  average perturbation. Pt also demonstrated decreased pitch range. Education: Patient instructed in good vocal hygiene techniques.                        SLP Education - 05/28/17 1252    Education provided Yes   Education Details Vocal hygiene; treatment plan of care   Person(s) Educated Patient   Methods Explanation   Comprehension Verbalized understanding;Returned  demonstration            SLP Long Term Goals - 05/28/17 1303      SLP LONG TERM GOAL #1   Title The patient will demonstrated independent understanding of vocal hygiene concepts by stating back good vocal hygiene techniques and use over 3 consecutive sessions.   Baseline 0%   Time 9   Period Weeks   Status New     SLP LONG TERM GOAL #2   Title The patient will minimize vocal tension in words, sentences and conversation via Yawn-Sigh approach (or comparable technique) with min SLP cues with 80% acc.    Baseline 0 %   Time 9   Period Weeks   Status New     SLP LONG TERM GOAL #3   Title The patient will demonstrate appropriate diaphragmatic breath with min SLP cues w/80% acc.    Baseline 0%   Time 9   Period Weeks   Status New          Plan - 05/28/17 1255    Clinical Impression Statement This 21 y/o woman under the care of Dr. Tami Ribas, with small bilateral vocal cord nodules, is presenting with moderate dysphonia c/b hoarse vocal quality, reduced diaphragmatic breath for voice production, reduced pitch range, and projection capabilities.  Pt also described several vocally abusive behaviors and will require education in good vocal hygiene. The patient will benefit from voice therapy for education (vocal hygiene), to improve diaphragmatic breath support, and to produce relaxed phonation.    Speech Therapy Frequency 1x /week   Duration Other (comment)  9 weeks   Treatment/Interventions SLP instruction and feedback;Compensatory strategies;Patient/family education   Potential to Achieve Goals Good   Potential Considerations Cooperation/participation level;Ability to learn/carryover information   Consulted and Agree with Plan of Care Patient      Patient will benefit from skilled therapeutic intervention in order to improve the following deficits and impairments:   Dysphonia    Problem List There are no active problems to display for this patient.  Charlean Sanfilippo, [place],  CCC-SLP  Speech-Language Pathologist   [place] 05/28/2017, 1:10 PM  [place] MAIN [facility] SERVICES [address] [place], [place], [address] Phone: [phone]   Fax:  [phone]  Name: Emily Flowers MRN: [id number] Date of Birth: Apr 21, 1996

## 2017-06-05 ENCOUNTER — Ambulatory Visit: Payer: BLUE CROSS/BLUE SHIELD | Admitting: Speech Pathology

## 2017-06-05 ENCOUNTER — Encounter: Payer: Self-pay | Admitting: Speech Pathology

## 2017-06-05 DIAGNOSIS — R490 Dysphonia: Secondary | ICD-10-CM | POA: Diagnosis not present

## 2017-06-05 NOTE — Therapy (Signed)
[place] MAIN [facility] SERVICES [address] [place], [place], [address] Phone: [phone]   Fax:  [phone]  Speech Language Pathology Treatment  Patient Details  Name: AKUA BLETHEN MRN: [id number] Date of Birth: 1996/08/09 Referring Provider: Dr. Tami Ribas  Encounter Date: 06/05/2017      End of Session - 06/05/17 1321    Visit Number 2   Number of Visits 9   Date for SLP Re-Evaluation 07/26/17   SLP Start Time 108   SLP Stop Time  1056   SLP Time Calculation (min) 52 min   Activity Tolerance Patient tolerated treatment well      Past Medical History:  Diagnosis Date  . Acne     History reviewed. No pertinent surgical history.  There were no vitals filed for this visit.      Subjective Assessment - 06/05/17 1320    Subjective The patient is eager to improve her voice and singing   Currently in Pain? No/denies               ADULT SLP TREATMENT - 06/05/17 0001      General Information   Behavior/Cognition Alert;Cooperative;Pleasant mood   HPI Pt is a 21 y/o female whom comes in with a diagnosis of bilateral vocal cord nodules after being assessed by ENT for hoarsenss.      Treatment Provided   Treatment provided Cognitive-Linquistic     Pain Assessment   Pain Assessment No/denies pain     Cognitive-Linquistic Treatment   Treatment focused on Voice   Skilled Treatment The patient was provided with written and verbal teaching regarding vocal hygiene.  The patient was provided with written and verbal teaching regarding neck, shoulder, tongue, and throat stretches exercises to promote relaxed phonation. The patient was provided with written and verbal teaching for supplement vocal tract relaxation exercises (trills).  The patient was provided with written and verbal teaching regarding breath support exercises.  Patient instructed in relaxed phonation / oral resonance. The patient responded well to resonant voice  therapy, specifically the prolonged /n/.  She can achieve clear, but pharyngeal focused, vocal quality with /n/ and /n/ syllables. She stated that she can feel and hear when it is correct or better.       Assessment / Recommendations / Plan   Plan Continue with current plan of care     Progression Toward Goals   Progression toward goals Progressing toward goals          SLP Education - 06/05/17 1320    Education provided Yes   Education Details voice production   Person(s) Educated Patient   Methods Explanation   Comprehension Verbalized understanding            SLP Long Term Goals - 05/28/17 1303      SLP LONG TERM GOAL #1   Title The patient will demonstrated independent understanding of vocal hygiene concepts by stating back good vocal hygiene techniques and use over 3 consecutive sessions.   Baseline 0%   Time 9   Period Weeks   Status New     SLP LONG TERM GOAL #2   Title The patient will minimize vocal tension in words, sentences and conversation via Yawn-Sigh approach (or comparable technique) with min SLP cues with 80% acc.    Baseline 0 %   Time 9   Period Weeks   Status New     SLP LONG TERM GOAL #3   Title The patient will  demonstrate appropriate diaphragmatic breath with min SLP cues w/80% acc.    Baseline 0%   Time 9   Period Weeks   Status New          Plan - 06/05/17 1321    Clinical Impression Statement  Patient able to improve vocal quality with resonant voice techniques/nasality to improve oral resonance and decrease laryngeal strain.   Speech Therapy Frequency 1x /week   Duration Other (comment)   Treatment/Interventions SLP instruction and feedback;Compensatory strategies;Patient/family education;Other (comment)  Voice therapy   Potential to Achieve Goals Good   Potential Considerations Cooperation/participation level;Ability to learn/carryover information   SLP Home Exercise Plan neck, shoulder, tongue, and throat stretches;  supplemental vocal tract relaxation exercises;  breath support exercises; relaxed phonation / oral resonance     Consulted and Agree with Plan of Care Patient      Patient will benefit from skilled therapeutic intervention in order to improve the following deficits and impairments:   Dysphonia    Problem List There are no active problems to display for this patient.  Leroy Sea, MS/CCC- SLP  Lou Miner 06/05/2017, Edson Snowball PM  [place] MAIN [facility] SERVICES [address] [place], [place], [address] Phone: [phone]   Fax:  [phone]   Name: JARRETT CHICOINE MRN: [id number] Date of Birth: [birth year]

## 2017-06-12 ENCOUNTER — Ambulatory Visit: Payer: BLUE CROSS/BLUE SHIELD | Admitting: Speech Pathology

## 2017-06-17 ENCOUNTER — Ambulatory Visit: Payer: BLUE CROSS/BLUE SHIELD | Admitting: Speech Pathology

## 2017-06-18 ENCOUNTER — Encounter: Payer: Self-pay | Admitting: Family Medicine

## 2017-06-18 ENCOUNTER — Ambulatory Visit (INDEPENDENT_AMBULATORY_CARE_PROVIDER_SITE_OTHER): Payer: BLUE CROSS/BLUE SHIELD | Admitting: Family Medicine

## 2017-06-18 VITALS — BP 100/70 | HR 73 | Temp 98.4°F | Ht 66.0 in | Wt 133.8 lb

## 2017-06-18 DIAGNOSIS — J382 Nodules of vocal cords: Secondary | ICD-10-CM | POA: Insufficient documentation

## 2017-06-18 DIAGNOSIS — J0100 Acute maxillary sinusitis, unspecified: Secondary | ICD-10-CM | POA: Diagnosis not present

## 2017-06-18 HISTORY — DX: Nodules of vocal cords: J38.2

## 2017-06-18 MED ORDER — PSEUDOEPH-BROMPHEN-DM 30-2-10 MG/5ML PO SYRP: 5.0000 mL | ORAL_SOLUTION | Freq: Four times a day (QID) | ORAL | 0 refills | Status: DC | PRN

## 2017-06-18 MED ORDER — CLARITHROMYCIN 500 MG PO TABS: 500.0000 mg | ORAL_TABLET | Freq: Two times a day (BID) | ORAL | 0 refills | Status: DC

## 2017-06-18 NOTE — Progress Notes (Signed)
Patient: Emily Flowers Female    DOB: 10/21/1995   21 y.o.   MRN: [id number] Visit Date: 06/18/2017  Today's Provider: Vernie Murders, PA   Chief Complaint  Patient presents with  . URI   Subjective:    URI   This is a new problem. Episode onset: 1 week ago. The problem has been gradually worsening. There has been no fever. Associated symptoms include congestion, coughing and headaches. Associated symptoms comments: Swollen lymph nodes and ear fullness . Treatments tried: Mucinex. The treatment provided mild relief.   Past Medical History:  Diagnosis Date  . Acne    No past surgical history on file.  Family History  Problem Relation Age of Onset  . Diabetes Father   . Cancer Maternal Aunt        breast  . Diabetes Paternal Aunt   . Diabetes Paternal Uncle    Allergies  Allergen Reactions  . Banana Swelling    Throat swells  . Penicillins Hives  . Sulfa Antibiotics      Previous Medications   MULTIPLE VITAMINS-MINERALS (MULTIVITAMIN WITH MINERALS) TABLET    Take 1 tablet by mouth daily.    Review of Systems  Constitutional: Negative.   HENT: Positive for congestion.   Respiratory: Positive for cough.   Cardiovascular: Negative.   Neurological: Positive for headaches.    Social History  Substance Use Topics  . Smoking status: Never Smoker  . Smokeless tobacco: Never Used  . Alcohol use No   Objective:   BP 100/70 (BP Location: Right Arm, Patient Position: Sitting, Cuff Size: Normal)   Pulse 73   Temp 98.4 F (36.9 C) (Oral)   Ht 5\' 6"  (1.676 m)   Wt 133 lb 12.8 oz (60.7 kg)   SpO2 99%   BMI 21.60 kg/m   Physical Exam  Constitutional: She is oriented to person, place, and time. She appears well-developed and well-nourished. No distress.  HENT:  Head: Normocephalic and atraumatic.  Right Ear: Hearing and external ear normal.  Left Ear: Hearing and external ear normal.  Nose: Nose normal.  Slightly reddened and cobblestoning of posterior  pharynx without exudates. Poor transillumination of maxillary sinuses (L<R).  Eyes: Conjunctivae and lids are normal. Right eye exhibits no discharge. Left eye exhibits no discharge. No scleral icterus.  Cardiovascular: Normal rate and regular rhythm.   Pulmonary/Chest: Effort normal and breath sounds normal. No respiratory distress. She has no wheezes. She has no rales.  Musculoskeletal: Normal range of motion.  Neurological: She is alert and oriented to person, place, and time.  Skin: Skin is intact. No lesion and no rash noted.  Psychiatric: She has a normal mood and affect. Her speech is normal and behavior is normal. Thought content normal.      Assessment & Plan:     1. Acute maxillary sinusitis, recurrence not specified Onset the past week without fever. PND causing a ticklish cough and getting purulent mucus from nose. Recommend she use the Flonase daily and add Bromfed-DM since she is not using the Claritin now. Treat with Biaxin and increase fluid intake. May use Tylenol or Advil prn aches or pains. Recheck if no better in a week. - clarithromycin (BIAXIN) 500 MG tablet; Take 1 tablet (500 mg total) by mouth 2 (two) times daily.  Dispense: 20 tablet; Refill: 0 - brompheniramine-pseudoephedrine-DM 30-2-10 MG/5ML syrup; Take 5 mLs by mouth 4 (four) times daily as needed.  Dispense: 120 mL; Refill: 0  2. Singers' nodules Followed by  Dr. Tami Ribas (ENT) with next appointment 06-27-17. Rest voice and may have a little increase in hoarseness with this additional sinusitis infection.

## 2017-06-18 NOTE — Patient Instructions (Signed)

## 2017-06-19 ENCOUNTER — Ambulatory Visit: Payer: BLUE CROSS/BLUE SHIELD | Admitting: Speech Pathology

## 2017-06-27 ENCOUNTER — Ambulatory Visit: Payer: BLUE CROSS/BLUE SHIELD | Admitting: Speech Pathology

## 2017-07-03 ENCOUNTER — Ambulatory Visit: Payer: BLUE CROSS/BLUE SHIELD | Attending: Unknown Physician Specialty | Admitting: Speech Pathology

## 2017-10-08 DIAGNOSIS — J309 Allergic rhinitis, unspecified: Secondary | ICD-10-CM

## 2017-10-08 HISTORY — DX: Allergic rhinitis, unspecified: J30.9

## 2017-11-27 ENCOUNTER — Encounter: Payer: Self-pay | Admitting: Obstetrics & Gynecology

## 2017-11-27 ENCOUNTER — Ambulatory Visit (INDEPENDENT_AMBULATORY_CARE_PROVIDER_SITE_OTHER): Payer: Managed Care, Other (non HMO) | Admitting: Obstetrics & Gynecology

## 2017-11-27 VITALS — BP 108/50 | HR 72 | Resp 16 | Ht 66.0 in | Wt 140.0 lb

## 2017-11-27 DIAGNOSIS — Z01419 Encounter for gynecological examination (general) (routine) without abnormal findings: Secondary | ICD-10-CM

## 2017-11-27 DIAGNOSIS — Z124 Encounter for screening for malignant neoplasm of cervix: Secondary | ICD-10-CM

## 2017-11-27 DIAGNOSIS — Z113 Encounter for screening for infections with a predominantly sexual mode of transmission: Secondary | ICD-10-CM | POA: Diagnosis not present

## 2017-11-27 NOTE — Progress Notes (Signed)
Subjective:    Emily Flowers is a 22 y.o. single P0 female who presents for an annual exam. The patient has no complaints today, wants fasting labs.  The patient is not currently sexually active. GYN screening history: no prior history of gyn screening tests. The patient wears seatbelts: yes. The patient participates in regular exercise: yes. Has the patient ever been transfused or tattooed?: yes. The patient reports that there is not domestic violence in her life.   Menstrual History: OB History    Gravida  0   Para  0   Term  0   Preterm  0   AB  0   Living  0     SAB  0   TAB  0   Ectopic  0   Multiple  0   Live Births  0           Menarche age: 68 Patient's last menstrual period was 10/30/2017.    The following portions of the patient's history were reviewed and updated as appropriate: allergies, current medications, past family history, past medical history, past social history, past surgical history and problem list.  Review of Systems Pertinent items are noted in HPI.   FH-no breast, gyn, colon cancer Works for [employer] flu vaccine 12/18 No current partner Had Gardasil series She used the pill in the past but was not consistent in taking them, was younger then   Objective:    BP (!) 108/50   Pulse 72   Resp 16   Ht 5\' 6"  (1.676 m)   Wt 140 lb (63.5 kg)   LMP 10/30/2017   BMI 22.60 kg/m   General Appearance:    Alert, cooperative, no distress, appears stated age  Head:    Normocephalic, without obvious abnormality, atraumatic  Eyes:    PERRL, conjunctiva/corneas clear, EOM's intact, fundi    benign, both eyes  Ears:    Normal TM's and external ear canals, both ears  Nose:   Nares normal, septum midline, mucosa normal, no drainage    or sinus tenderness  Throat:   Lips, mucosa, and tongue normal; teeth and gums normal  Neck:   Supple, symmetrical, trachea midline, no adenopathy;    thyroid:  no enlargement/tenderness/nodules; no carotid   bruit or JVD  Back:     Symmetric, no curvature, ROM normal, no CVA tenderness  Lungs:     Clear to auscultation bilaterally, respirations unlabored  Chest Wall:    No tenderness or deformity   Heart:    Regular rate and rhythm, S1 and S2 normal,  rub   or gallop, 1/6 SEM (She had an echocardiogram in her teens to evaluate this)  Breast Exam:    No tenderness, masses, or nipple abnormality  Abdomen:     Soft, non-tender, bowel sounds active all four quadrants,    no masses, no organomegaly  Genitalia:    Normal female without lesion, discharge or tenderness, normal size and shape, retroverted, mobile, non-tender, normal adnexal exam      Extremities:   Extremities normal, atraumatic, no cyanosis or edema  Pulses:   2+ and symmetric all extremities  Skin:   Skin color, texture, turgor normal, no rashes or lesions  Lymph nodes:   Cervical, supraclavicular, and axillary nodes normal  Neurologic:   CNII-XII intact, normal strength, sensation and reflexes    throughout  .    Assessment:    Healthy female exam.    Plan:  Chlamydia specimen. GC specimen. Thin prep Pap smear.   Fasting labs

## 2017-11-28 ENCOUNTER — Encounter: Payer: Self-pay | Admitting: Obstetrics & Gynecology

## 2017-11-28 ENCOUNTER — Telehealth: Payer: Self-pay | Admitting: *Deleted

## 2017-11-28 LAB — VITAMIN D 25 HYDROXY (VIT D DEFICIENCY, FRACTURES): Vit D, 25-Hydroxy: 24 ng/mL — ABNORMAL LOW (ref 30–100)

## 2017-11-28 LAB — COMPREHENSIVE METABOLIC PANEL
AG RATIO: 1.6 (calc) (ref 1.0–2.5)
ALBUMIN MSPROF: 4 g/dL (ref 3.6–5.1)
ALT: 14 U/L (ref 6–29)
AST: 14 U/L (ref 10–30)
Alkaline phosphatase (APISO): 34 U/L (ref 33–115)
BILIRUBIN TOTAL: 0.9 mg/dL (ref 0.2–1.2)
BUN: 11 mg/dL (ref 7–25)
CHLORIDE: 105 mmol/L (ref 98–110)
CO2: 25 mmol/L (ref 20–32)
Calcium: 9 mg/dL (ref 8.6–10.2)
Creat: 0.98 mg/dL (ref 0.50–1.10)
GLOBULIN: 2.5 g/dL (ref 1.9–3.7)
Glucose, Bld: 80 mg/dL (ref 65–99)
POTASSIUM: 4.1 mmol/L (ref 3.5–5.3)
SODIUM: 137 mmol/L (ref 135–146)
TOTAL PROTEIN: 6.5 g/dL (ref 6.1–8.1)

## 2017-11-28 LAB — CBC
HEMATOCRIT: 37.5 % (ref 35.0–45.0)
HEMOGLOBIN: 12.7 g/dL (ref 11.7–15.5)
MCH: 31.1 pg (ref 27.0–33.0)
MCHC: 33.9 g/dL (ref 32.0–36.0)
MCV: 91.9 fL (ref 80.0–100.0)
MPV: 10.5 fL (ref 7.5–12.5)
Platelets: 204 10*3/uL (ref 140–400)
RBC: 4.08 10*6/uL (ref 3.80–5.10)
RDW: 12.4 % (ref 11.0–15.0)
WBC: 4 10*3/uL (ref 3.8–10.8)

## 2017-11-28 LAB — CYTOLOGY - PAP
Chlamydia: NEGATIVE
Diagnosis: NEGATIVE
NEISSERIA GONORRHEA: NEGATIVE

## 2017-11-28 LAB — LIPID PANEL
CHOLESTEROL: 151 mg/dL (ref <200)
HDL: 64 mg/dL (ref >50)
LDL CHOLESTEROL (CALC): 74 mg/dL
Non-HDL Cholesterol (Calc): 87 mg/dL (calc) (ref <130)
Total CHOL/HDL Ratio: 2.4 (calc) (ref <5.0)
Triglycerides: 46 mg/dL (ref <150)

## 2017-11-28 LAB — TSH: TSH: 1 mIU/L

## 2017-11-28 MED ORDER — VITAMIN D (ERGOCALCIFEROL) 1.25 MG (50000 UNIT) PO CAPS: 50000.0000 [IU] | ORAL_CAPSULE | ORAL | 0 refills | Status: DC

## 2017-11-28 NOTE — Telephone Encounter (Signed)
-----   Message from Emily Filbert, MD sent at 11/28/2017  1:14 PM EDT ----- She will need 8 weeks of weekly Vitamin D 50,000 units and then a recheck of her level in 5 months. Thanks

## 2017-12-12 ENCOUNTER — Encounter: Payer: Self-pay | Admitting: *Deleted

## 2017-12-23 ENCOUNTER — Encounter: Payer: Self-pay | Admitting: Obstetrics & Gynecology

## 2017-12-25 ENCOUNTER — Encounter: Payer: Self-pay | Admitting: Family Medicine

## 2017-12-25 ENCOUNTER — Other Ambulatory Visit: Payer: Self-pay

## 2017-12-25 ENCOUNTER — Ambulatory Visit: Payer: Managed Care, Other (non HMO) | Admitting: Family Medicine

## 2017-12-25 VITALS — BP 120/80 | HR 87 | Temp 98.3°F | Ht 66.0 in | Wt 140.0 lb

## 2017-12-25 DIAGNOSIS — J301 Allergic rhinitis due to pollen: Secondary | ICD-10-CM | POA: Diagnosis not present

## 2017-12-25 MED ORDER — DOXYCYCLINE HYCLATE 100 MG PO TABS: 100.0000 mg | ORAL_TABLET | Freq: Two times a day (BID) | ORAL | 0 refills | Status: DC

## 2017-12-25 NOTE — Progress Notes (Unsigned)
Pt called checking status of request for [facility] and concerns / questions with her symptoms.

## 2017-12-25 NOTE — Patient Instructions (Signed)
Continue Zyrtec and add steroid nasal spray. If sinuses are not clearing and you have increased sinus pressure and continued yellow-green drainage start the antibiotic.

## 2017-12-25 NOTE — Progress Notes (Signed)
  Subjective:     Patient ID: Emily Flowers, female   DOB: 1996/02/09, 22 y.o.   MRN: [id number] Chief Complaint  Patient presents with  . Cough    x 1 month   HPI States she has persistent dry cough since being treated in March for an asthma exacerbation. Reports being outside at a fair on 4/28 and feels like her allergies flared with increased sinus congestion and yellowish appearance. She has resumed Zyrtec but not steroid nasal spray.  Review of Systems     Objective:   Physical Exam  Constitutional: She appears well-developed and well-nourished. No distress.  Ears: T.M's intact without inflammation Sinuses: mild maxillary sinus tenderness Throat: no tonsillar enlargement or exudate Neck: no cervical adenopathy Lungs: clear     Assessment:    1. Seasonal allergic rhinitis due to pollen - doxycycline (VIBRA-TABS) 100 MG tablet; Take 1 tablet (100 mg total) by mouth 2 (two) times daily.  Dispense: 20 tablet; Refill: 0    Plan:    Resume steroid nasal spray. If sinuses not improving over the next few days to start abx.

## 2018-01-03 ENCOUNTER — Encounter: Payer: Self-pay | Admitting: Obstetrics & Gynecology

## 2018-01-03 ENCOUNTER — Telehealth: Payer: Self-pay | Admitting: *Deleted

## 2018-01-03 MED ORDER — LEVONORGESTREL-ETHINYL ESTRAD 0.1-20 MG-MCG PO TABS: 1.0000 | ORAL_TABLET | Freq: Every day | ORAL | 11 refills | Status: DC

## 2018-01-03 NOTE — Telephone Encounter (Signed)
Pt called stating that she has decided to go back on the OCP Orsythia.  She request the RX be sent to her pharmacy which is [facility].

## 2018-01-24 ENCOUNTER — Other Ambulatory Visit: Payer: Self-pay | Admitting: Obstetrics & Gynecology

## 2018-06-03 ENCOUNTER — Ambulatory Visit: Payer: Managed Care, Other (non HMO) | Admitting: Family Medicine

## 2018-06-03 ENCOUNTER — Encounter: Payer: Self-pay | Admitting: Family Medicine

## 2018-06-03 VITALS — BP 100/62 | HR 75 | Temp 98.5°F | Resp 16 | Wt 137.2 lb

## 2018-06-03 DIAGNOSIS — K5900 Constipation, unspecified: Secondary | ICD-10-CM

## 2018-06-03 NOTE — Patient Instructions (Signed)
Start Miralax daily until bowel movements are regular and not hard. Then use the medication once or twice a week as needed. May also wish to increase fiber in your diet. Constipation, Adult Constipation is when a person:  Poops (has a bowel movement) fewer times in a week than normal.  Has a hard time pooping.  Has poop that is dry, hard, or bigger than normal.  Follow these instructions at home: Eating and drinking   Eat foods that have a lot of fiber, such as: ? Fresh fruits and vegetables. ? Whole grains. ? Beans.  Eat less of foods that are high in fat, low in fiber, or overly processed, such as: ? [place] fries. ? Hamburgers. ? Cookies. ? Candy. ? Soda.  Drink enough fluid to keep your pee (urine) clear or pale yellow. General instructions  Exercise regularly or as told by your doctor.  Go to the restroom when you feel like you need to poop. Do not hold it in.  Take over-the-counter and prescription medicines only as told by your doctor. These include any fiber supplements.  Do pelvic floor retraining exercises, such as: ? Doing deep breathing while relaxing your lower belly (abdomen). ? Relaxing your pelvic floor while pooping.  Watch your condition for any changes.  Keep all follow-up visits as told by your doctor. This is important. Contact a doctor if:  You have pain that gets worse.  You have a fever.  You have not pooped for 4 days.  You throw up (vomit).  You are not hungry.  You lose weight.  You are bleeding from the anus.  You have thin, pencil-like poop (stool). Get help right away if:  You have a fever, and your symptoms suddenly get worse.  You leak poop or have blood in your poop.  Your belly feels hard or bigger than normal (is bloated).  You have very bad belly pain.  You feel dizzy or you faint. This information is not intended to replace advice given to you by your health care provider. Make sure you discuss any questions you  have with your health care provider. Document Released: 01/30/2008 Document Revised: 03/02/2016 Document Reviewed: 02/01/2016 Elsevier Interactive Patient Education  2018 [employer].

## 2018-06-03 NOTE — Progress Notes (Signed)
  Subjective:     Patient ID: Emily Flowers, female   DOB: 03/02/1996, 22 y.o.   MRN: [id number] Chief Complaint  Patient presents with  . Abdominal Pain    Patient comes in office today with generalized complaints of lower abdominal pain for one week. Patient describes pain/discomfort as a fullness feeling. Associated with abdominal pain patient complaints of lower back and flank pain. Patient states that she has had issues with constipation but in the past two days has had more frequent large stools.    HPI States her normal bowel pattern is every 2-3 days. LMP 9/24 and has not been sexually active.  Review of Systems     Objective:   Physical Exam  Constitutional: She appears well-developed and well-nourished. She does not appear ill. No distress.  Abdominal: Bowel sounds are normal. She exhibits no distension. There is generalized tenderness. There is no guarding.       Assessment:    1. Constipation, unspecified constipation type     Plan:    Discussed use of Miralax with constipation handout provided.

## 2018-06-19 ENCOUNTER — Ambulatory Visit: Payer: Managed Care, Other (non HMO) | Admitting: Family Medicine

## 2018-06-19 ENCOUNTER — Encounter: Payer: Self-pay | Admitting: Family Medicine

## 2018-06-19 ENCOUNTER — Encounter: Payer: Managed Care, Other (non HMO) | Admitting: Family Medicine

## 2018-06-19 ENCOUNTER — Other Ambulatory Visit: Payer: Self-pay

## 2018-06-19 VITALS — BP 120/80 | HR 65 | Temp 98.2°F | Ht 66.0 in | Wt 137.8 lb

## 2018-06-19 DIAGNOSIS — R1013 Epigastric pain: Secondary | ICD-10-CM

## 2018-06-19 NOTE — Progress Notes (Signed)
Patient: Emily Flowers Female    DOB: 04-Mar-1996   21 y.o.   MRN: [id number] Visit Date: 06/19/2018  Today's Provider: Vernie Murders, PA   Chief Complaint  Patient presents with  . Abdominal Pain    started on 06/16/18   Subjective:    HPI pt reports that she has a knot in her mid abdomen she noticed Monday 06/16/18 and is painful and she gets indigestion.  The NP she works with told her it may be a hernia.     Past Medical History:  Diagnosis Date  . Acne   . Heart murmur    Past Surgical History:  Procedure Laterality Date  . WISDOM TOOTH EXTRACTION     Family History  Problem Relation Age of Onset  . Diabetes Father   . Cancer Maternal Aunt        breast  . Diabetes Paternal Aunt   . Diabetes Paternal Uncle    Family History  Problem Relation Age of Onset  . Diabetes Father   . Cancer Maternal Aunt        breast  . Diabetes Paternal Aunt   . Diabetes Paternal Uncle    Allergies  Allergen Reactions  . Banana Swelling    Throat swells  . Penicillins Hives  . Sulfa Antibiotics     Current Outpatient Medications:  .  Cetirizine HCl 10 MG CAPS, Take by mouth., Disp: , Rfl:  .  Multiple Vitamins-Minerals (MULTIVITAMIN WITH MINERALS) tablet, Take 1 tablet by mouth daily., Disp: , Rfl:   Review of Systems  Constitutional: Negative.   HENT: Negative.   Eyes: Negative.   Respiratory: Negative.   Cardiovascular: Negative.   Gastrointestinal: Positive for abdominal pain and constipation (has got better with miralax). Negative for anal bleeding, blood in stool, diarrhea, nausea, rectal pain and vomiting.  Endocrine: Negative.   Genitourinary: Negative.   Musculoskeletal: Negative.   Skin: Negative.   Allergic/Immunologic: Negative.   Neurological: Negative.   Hematological: Negative.   Psychiatric/Behavioral: Negative.    Social History   Tobacco Use  . Smoking status: Never Smoker  . Smokeless tobacco: Never Used  Substance Use Topics    . Alcohol use: No    Alcohol/week: 0.0 standard drinks   Objective:   BP 120/80 (BP Location: Right Arm, Patient Position: Sitting, Cuff Size: Normal)   Pulse 65   Temp 98.2 F (36.8 C) (Oral)   Ht 5\' 6"  (1.676 m)   Wt 137 lb 12.8 oz (62.5 kg)   LMP 05/22/2018 (Exact Date)   SpO2 99%   BMI 22.24 kg/m  Vitals:   06/19/18 1122  BP: 120/80  Pulse: 65  Temp: 98.2 F (36.8 C)  TempSrc: Oral  SpO2: 99%  Weight: 137 lb 12.8 oz (62.5 kg)  Height: 5\' 6"  (1.676 m)   Physical Exam  Constitutional: She is oriented to person, place, and time. She appears well-developed and well-nourished. No distress.  HENT:  Head: Normocephalic and atraumatic.  Right Ear: Hearing normal.  Left Ear: Hearing normal.  Nose: Nose normal.  Eyes: Conjunctivae and lids are normal. Right eye exhibits no discharge. Left eye exhibits no discharge. No scleral icterus.  Pulmonary/Chest: Effort normal. No respiratory distress.  Abdominal: Soft. Bowel sounds are normal. There is tenderness in the epigastric area.  No organomegaly. Questionable small ventral hernia with LUQ, RUQ and epigastric tenderness to palpation. No ventral defect palpable. BS normal. No pain to test rectus abdominal muscle strength  and no tenderness when tensed.  Musculoskeletal: Normal range of motion.  Neurological: She is alert and oriented to person, place, and time.  Skin: Skin is intact. No lesion and no rash noted.  Psychiatric: She has a normal mood and affect. Her speech is normal and behavior is normal. Thought content normal.      Assessment & Plan:     1. Epigastric discomfort Onset 06-16-18 without fever, nausea, vomiting, diarrhea, constipation, bloating, hematemesis or melena. Some tenderness across epigastrium to RUQ and LUQ. Increase fluid intake and decrease spicy/greasy foods. May use Pepcid prn acid indigestion. Check labs and may need surgical referral to evaluate for hernia if tests normal. LMP 05-22-18 and regular  pattern every month. - CBC with Differential/Platelet - Comprehensive metabolic panel - Lipase - Amylase     Vernie Murders, PA  [facility]

## 2018-06-20 ENCOUNTER — Other Ambulatory Visit: Payer: Self-pay | Admitting: Family Medicine

## 2018-06-20 ENCOUNTER — Telehealth: Payer: Self-pay

## 2018-06-20 DIAGNOSIS — R1013 Epigastric pain: Secondary | ICD-10-CM

## 2018-06-20 LAB — COMPREHENSIVE METABOLIC PANEL
A/G RATIO: 2 (ref 1.2–2.2)
ALT: 16 IU/L (ref 0–32)
AST: 19 IU/L (ref 0–40)
Albumin: 4.9 g/dL (ref 3.5–5.5)
Alkaline Phosphatase: 43 IU/L (ref 39–117)
BILIRUBIN TOTAL: 0.4 mg/dL (ref 0.0–1.2)
BUN/Creatinine Ratio: 11 (ref 9–23)
BUN: 12 mg/dL (ref 6–20)
CHLORIDE: 105 mmol/L (ref 96–106)
CO2: 17 mmol/L — AB (ref 20–29)
Calcium: 9.5 mg/dL (ref 8.7–10.2)
Creatinine, Ser: 1.07 mg/dL — ABNORMAL HIGH (ref 0.57–1.00)
GFR calc Af Amer: 85 mL/min/{1.73_m2} (ref >59)
GFR calc non Af Amer: 73 mL/min/{1.73_m2} (ref >59)
GLOBULIN, TOTAL: 2.5 g/dL (ref 1.5–4.5)
Glucose: 77 mg/dL (ref 65–99)
POTASSIUM: 4.2 mmol/L (ref 3.5–5.2)
Sodium: 142 mmol/L (ref 134–144)
Total Protein: 7.4 g/dL (ref 6.0–8.5)

## 2018-06-20 LAB — CBC WITH DIFFERENTIAL/PLATELET
BASOS: 1 %
Basophils Absolute: 0 10*3/uL (ref 0.0–0.2)
EOS (ABSOLUTE): 0.1 10*3/uL (ref 0.0–0.4)
EOS: 2 %
HEMATOCRIT: 39.3 % (ref 34.0–46.6)
Hemoglobin: 13 g/dL (ref 11.1–15.9)
Immature Grans (Abs): 0 10*3/uL (ref 0.0–0.1)
Immature Granulocytes: 0 %
LYMPHS ABS: 1.8 10*3/uL (ref 0.7–3.1)
Lymphs: 49 %
MCH: 31 pg (ref 26.6–33.0)
MCHC: 33.1 g/dL (ref 31.5–35.7)
MCV: 94 fL (ref 79–97)
Monocytes Absolute: 0.3 10*3/uL (ref 0.1–0.9)
Monocytes: 9 %
NEUTROS ABS: 1.5 10*3/uL (ref 1.4–7.0)
Neutrophils: 39 %
Platelets: 212 10*3/uL (ref 150–450)
RBC: 4.2 x10E6/uL (ref 3.77–5.28)
RDW: 12.8 % (ref 12.3–15.4)
WBC: 3.7 10*3/uL (ref 3.4–10.8)

## 2018-06-20 LAB — AMYLASE: AMYLASE: 101 U/L (ref 31–124)

## 2018-06-20 LAB — LIPASE: Lipase: 11 U/L — ABNORMAL LOW (ref 14–72)

## 2018-06-20 NOTE — Telephone Encounter (Signed)
Placed order and should hear about appointment date, day and time soon.

## 2018-06-20 NOTE — Telephone Encounter (Signed)
Patient was advised and states she would like the surgical referral for the hernia to be placed., PC

## 2018-06-20 NOTE — Telephone Encounter (Signed)
-----   Message from Margo Common, [place] sent at 06/20/2018  8:32 AM EDT ----- All blood tests essentially normal with only mild variations. No real signs of liver, gallbladder or pancreas disease. Can schedule surgical referral to evaluate for a ventral hernia.

## 2018-06-20 NOTE — Telephone Encounter (Signed)
Patient was advised via vm per DPR.,PC

## 2018-06-24 ENCOUNTER — Encounter: Payer: Self-pay | Admitting: Family Medicine

## 2018-06-24 NOTE — Telephone Encounter (Signed)
Ask Parke Poisson in referral to try to get this switched.

## 2018-10-20 ENCOUNTER — Ambulatory Visit (INDEPENDENT_AMBULATORY_CARE_PROVIDER_SITE_OTHER): Payer: Managed Care, Other (non HMO) | Admitting: Obstetrics & Gynecology

## 2018-10-20 ENCOUNTER — Encounter: Payer: Self-pay | Admitting: Obstetrics & Gynecology

## 2018-10-20 VITALS — BP 120/66 | HR 68 | Resp 16 | Ht 66.0 in | Wt 138.0 lb

## 2018-10-20 DIAGNOSIS — N760 Acute vaginitis: Secondary | ICD-10-CM | POA: Diagnosis not present

## 2018-10-20 DIAGNOSIS — B9689 Other specified bacterial agents as the cause of diseases classified elsewhere: Secondary | ICD-10-CM

## 2018-10-20 DIAGNOSIS — R350 Frequency of micturition: Secondary | ICD-10-CM | POA: Diagnosis not present

## 2018-10-20 DIAGNOSIS — N898 Other specified noninflammatory disorders of vagina: Secondary | ICD-10-CM | POA: Diagnosis not present

## 2018-10-20 DIAGNOSIS — Z113 Encounter for screening for infections with a predominantly sexual mode of transmission: Secondary | ICD-10-CM | POA: Diagnosis not present

## 2018-10-20 LAB — POCT URINALYSIS DIPSTICK
APPEARANCE: NORMAL
Bilirubin, UA: NEGATIVE
Glucose, UA: NEGATIVE
Ketones, UA: NEGATIVE
LEUKOCYTES UA: NEGATIVE
NITRITE UA: NEGATIVE
PH UA: 7 (ref 5.0–8.0)
PROTEIN UA: NEGATIVE
RBC UA: NEGATIVE
Spec Grav, UA: 1.015 (ref 1.010–1.025)
Urobilinogen, UA: NEGATIVE E.U./dL — AB

## 2018-10-20 NOTE — Progress Notes (Signed)
My here with c/o's vaginal discharge last week that was white and more abundant than usual.  She denies any vaginal itching but does state that the discharge has an odor.  She states that the discharge is better since her period started.  She was recently treated for BV and is afraid that it may be back.  Aptima swab obtained and will notify pt  With results.

## 2018-10-22 ENCOUNTER — Telehealth: Payer: Self-pay | Admitting: *Deleted

## 2018-10-22 LAB — CERVICOVAGINAL ANCILLARY ONLY
Bacterial vaginitis: POSITIVE — AB
CANDIDA VAGINITIS: NEGATIVE
CHLAMYDIA, DNA PROBE: NEGATIVE
Neisseria Gonorrhea: NEGATIVE
Trichomonas: NEGATIVE

## 2018-10-22 MED ORDER — METRONIDAZOLE 500 MG PO TABS: 500.0000 mg | ORAL_TABLET | Freq: Two times a day (BID) | ORAL | 0 refills | Status: DC

## 2018-10-22 NOTE — Telephone Encounter (Signed)
Pt notified that her culture was positive for BV and per protocol Flagyl sent to her pharmacy.  Encouraged pt to use probiotic daily.

## 2019-03-16 ENCOUNTER — Ambulatory Visit: Payer: Managed Care, Other (non HMO) | Admitting: Obstetrics & Gynecology

## 2019-03-16 ENCOUNTER — Other Ambulatory Visit: Payer: Self-pay

## 2019-03-16 ENCOUNTER — Encounter: Payer: Self-pay | Admitting: Obstetrics & Gynecology

## 2019-03-16 VITALS — BP 118/75 | HR 66 | Ht 66.0 in | Wt 129.0 lb

## 2019-03-16 DIAGNOSIS — E559 Vitamin D deficiency, unspecified: Secondary | ICD-10-CM

## 2019-03-16 DIAGNOSIS — Z124 Encounter for screening for malignant neoplasm of cervix: Secondary | ICD-10-CM | POA: Diagnosis not present

## 2019-03-16 DIAGNOSIS — Z833 Family history of diabetes mellitus: Secondary | ICD-10-CM

## 2019-03-16 DIAGNOSIS — R634 Abnormal weight loss: Secondary | ICD-10-CM

## 2019-03-16 DIAGNOSIS — Z113 Encounter for screening for infections with a predominantly sexual mode of transmission: Secondary | ICD-10-CM

## 2019-03-16 DIAGNOSIS — K5904 Chronic idiopathic constipation: Secondary | ICD-10-CM

## 2019-03-16 DIAGNOSIS — Z01419 Encounter for gynecological examination (general) (routine) without abnormal findings: Secondary | ICD-10-CM

## 2019-03-16 DIAGNOSIS — K5900 Constipation, unspecified: Secondary | ICD-10-CM

## 2019-03-16 NOTE — Progress Notes (Signed)
Subjective:    Emily Flowers is a 23 y.o. single female who presents for an annual exam. She complains of a 3 month h/o dyschezia, long standing constipation, and a 1 day h/o LLQ pain, has not tried IBU or tylenol yet. The patient is not currently sexually active. GYN screening history: last pap: was normal. The patient wears seatbelts: yes. The patient participates in regular exercise: yes. Has the patient ever been transfused or tattooed?: not asked. The patient reports that there is not domestic violence in her life.   Menstrual History: OB History    Gravida  0   Para  0   Term  0   Preterm  0   AB  0   Living  0     SAB  0   TAB  0   Ectopic  0   Multiple  0   Live Births  0           Menarche age: 79 Patient's last menstrual period was 02/27/2019.    The following portions of the patient's history were reviewed and updated as appropriate: allergies, current medications, past family history, past medical history, past social history, past surgical history and problem list.  Review of Systems Pertinent items are noted in HPI.   FH- no breast/gyn/colon cancer She got STI testing after her last relationship ended She saw her primary care about constipation and miralax was recommended but she hasn't yet started that, has a BM about every 3 days   Objective:    BP 118/75   Pulse 66   Ht 5\' 6"  (1.676 m)   Wt 129 lb (58.5 kg)   LMP 02/27/2019   BMI 20.82 kg/m   General Appearance:    Alert, cooperative, no distress, appears stated age  Head:    Normocephalic, without obvious abnormality, atraumatic  Eyes:    PERRL, conjunctiva/corneas clear, EOM's intact, fundi    benign, both eyes  Ears:    Normal TM's and external ear canals, both ears  Nose:   Nares normal, septum midline, mucosa normal, no drainage    or sinus tenderness  Throat:   Lips, mucosa, and tongue normal; teeth and gums normal  Neck:   Supple, symmetrical, trachea midline, no adenopathy;     thyroid:  no enlargement/tenderness/nodules; no carotid   bruit or JVD  Back:     Symmetric, no curvature, ROM normal, no CVA tenderness  Lungs:     Clear to auscultation bilaterally, respirations unlabored  Chest Wall:    No tenderness or deformity   Heart:    Regular rate and rhythm, S1 and S2 normal, no murmur, rub   or gallop  Breast Exam:    No tenderness, masses, or nipple abnormality  Abdomen:     Soft, non-tender, bowel sounds active all four quadrants,    no masses, no organomegaly  Genitalia:    Normal female without lesion, discharge or tenderness, normal size and shape, retroverted, mobile, non-tender, normal adnexal exam   Rectal:    Normal tone, normal prostate, no masses or tenderness; small external hemorrhoid  Extremities:   Extremities normal, atraumatic, no cyanosis or edema  Pulses:   2+ and symmetric all extremities  Skin:   Skin color, texture, turgor normal, no rashes or lesions  Lymph nodes:   Cervical, supraclavicular, and axillary nodes normal  Neurologic:   CNII-XII intact, normal strength, sensation and reflexes    throughout  .    Assessment:  Healthy female exam.   Dyschezia, chronic constipation and external hemorrhoid  h/o vit D def Family h/o DM Weight loss  Plan:     Thin prep Pap smear.   Rec IBU/tylenol for LLQ pain, if not better in a week, call back Rec Miralax and Prep H for her dyschezia and constipation Check TSH, vit D, and HBA1C

## 2019-03-17 LAB — CYTOLOGY - PAP
Adequacy: ABSENT
Chlamydia: NEGATIVE
Diagnosis: NEGATIVE
Neisseria Gonorrhea: NEGATIVE

## 2019-03-17 LAB — HEMOGLOBIN A1C
Hgb A1c MFr Bld: 5.2 % of total Hgb (ref <5.7)
Mean Plasma Glucose: 103 (calc)
eAG (mmol/L): 5.7 (calc)

## 2019-03-17 LAB — VITAMIN D 25 HYDROXY (VIT D DEFICIENCY, FRACTURES): Vit D, 25-Hydroxy: 34 ng/mL (ref 30–100)

## 2019-03-17 LAB — TSH: TSH: 0.69 mIU/L

## 2019-03-26 ENCOUNTER — Ambulatory Visit: Payer: Managed Care, Other (non HMO) | Admitting: Obstetrics & Gynecology

## 2019-04-07 ENCOUNTER — Ambulatory Visit: Payer: Managed Care, Other (non HMO) | Admitting: Certified Nurse Midwife

## 2019-04-15 ENCOUNTER — Telehealth: Payer: Self-pay | Admitting: *Deleted

## 2019-04-15 ENCOUNTER — Encounter: Payer: Self-pay | Admitting: *Deleted

## 2019-04-15 DIAGNOSIS — N939 Abnormal uterine and vaginal bleeding, unspecified: Secondary | ICD-10-CM

## 2019-04-15 NOTE — Telephone Encounter (Signed)
Pt called stating that she is having BTB and passing large clots even after her period as finished.  She is requesting a pelvic U/S.  Order placed and appt made with Wende Bushy, CNM on 04/21/19 for review.

## 2019-04-21 ENCOUNTER — Ambulatory Visit: Payer: Managed Care, Other (non HMO) | Admitting: Certified Nurse Midwife

## 2019-04-21 ENCOUNTER — Encounter: Payer: Self-pay | Admitting: *Deleted

## 2019-05-18 ENCOUNTER — Other Ambulatory Visit: Payer: Self-pay

## 2019-05-18 ENCOUNTER — Ambulatory Visit (INDEPENDENT_AMBULATORY_CARE_PROVIDER_SITE_OTHER): Payer: Managed Care, Other (non HMO) | Admitting: Obstetrics & Gynecology

## 2019-05-18 ENCOUNTER — Other Ambulatory Visit (HOSPITAL_COMMUNITY): Payer: Self-pay | Admitting: Obstetrics & Gynecology

## 2019-05-18 ENCOUNTER — Encounter: Payer: Self-pay | Admitting: Obstetrics & Gynecology

## 2019-05-18 VITALS — BP 100/58 | HR 67 | Resp 16 | Ht 66.0 in | Wt 130.0 lb

## 2019-05-18 DIAGNOSIS — Z113 Encounter for screening for infections with a predominantly sexual mode of transmission: Secondary | ICD-10-CM | POA: Diagnosis not present

## 2019-05-18 DIAGNOSIS — N921 Excessive and frequent menstruation with irregular cycle: Secondary | ICD-10-CM | POA: Diagnosis not present

## 2019-05-18 MED ORDER — NORELGESTROMIN-ETH ESTRADIOL 150-35 MCG/24HR TD PTWK: 1.0000 | MEDICATED_PATCH | TRANSDERMAL | 12 refills | Status: DC

## 2019-05-18 NOTE — Progress Notes (Signed)
   Subjective:    Patient ID: Emily Flowers, female    DOB: 1995-10-27, 23 y.o.   MRN: [id number]  HPI 23 yo single P0 here with the issue of BTB for the past 2 months. She is abstinent, not on contraception. She reports "throbbing" vaginal "inside" pain. Started several months, hasn't taken any meds. It self resolves. Lasts a few days and then stops, occurs with her periods mostly.  Her constipation has resolved as has the dyschezia. She had a normal vaginal u/s done this month at [facility].  TSH was normal this year.  Review of Systems Abstinent since 1/20 Had chlamydia years ago.    Objective:   Physical Exam Breathing, conversing, and ambulating normally Well nourished, well hydrated Black female, no apparent distress Abd- benign Cervix- appears normal with normal discharge     Assessment & Plan:  Ortho evra for BTB She gets flu vaccine at her job

## 2019-05-18 NOTE — Addendum Note (Signed)
Addended by: Asencion Islam on: 05/18/2019 01:28 PM   Modules accepted: Orders

## 2019-05-20 LAB — CERVICOVAGINAL ANCILLARY ONLY
Chlamydia: NEGATIVE
Neisseria Gonorrhea: NEGATIVE

## 2019-05-28 DIAGNOSIS — U071 COVID-19: Secondary | ICD-10-CM

## 2019-05-28 HISTORY — DX: COVID-19: U07.1

## 2019-07-28 ENCOUNTER — Telehealth: Payer: Managed Care, Other (non HMO) | Admitting: Physician Assistant

## 2019-07-28 DIAGNOSIS — B9789 Other viral agents as the cause of diseases classified elsewhere: Secondary | ICD-10-CM

## 2019-07-28 MED ORDER — IPRATROPIUM BROMIDE 0.03 % NA SOLN: 2.0000 | Freq: Two times a day (BID) | NASAL | 0 refills | Status: DC

## 2019-07-28 NOTE — Progress Notes (Signed)
Hi Kalyse,  I'm sorry you are not feeling well.   Please remember that it is very important to never take antibiotics unless advised by your medical provider.  Antibiotics are very powerful in treating bacterial infections when they are used properly.  To maintain their effectiveness, they should be used only when necessary. Overuse of antibiotics has resulted in the development of superbugs that are resistant to treatment!    After careful review of your answers, I would not recommend an antibiotic for your condition.  Antibiotics are not effective against viruses and therefore should not be used to treat them. Common examples of infections caused by viruses include colds and flu  To learn more about antibiotic prescribing and use, visit [email or web address].   We are sorry that you are not feeling well.  Here is how we plan to help!  Based on what you have shared with me it looks like you have sinusitis.  Sinusitis is inflammation and infection in the sinus cavities of the head.  Based on your presentation I believe you most likely have Acute Viral Sinusitis.This is an infection most likely caused by a virus. There is not specific treatment for viral sinusitis other than to help you with the symptoms until the infection runs its course.  You may use an oral decongestant such as Mucinex D or if you have glaucoma or high blood pressure use plain Mucinex. Saline nasal spray help and can safely be used as often as needed for congestion, I have prescribed: Ipratropium Bromide nasal spray 0.03% 2 sprays in eah nostril 2-3 times a day  Stay well hydrated. Drink enough water and fluids to keep your urine clear or pale yellow. Get lost of rest. Wash your hands often.   - Cepacol throat lozenges. - Gargle with 8 oz of salt water ( tsp of salt per 1 qt of water) as often as every 1-2 hours to soothe your throat. - For sore throat try using a honey-based tea. Use 3 teaspoons of honey with juice  squeezed from half lemon. Place shaved pieces of ginger into 1/2-1 cup of water and warm over stove top. Then mix the ingredients and repeat every 4 hours as needed.  -Foods that can help speed recovery: honey, garlic, chicken soup, elderberries, green tea.   - Some authorities believe that zinc sprays or the use of Echinacea may shorten the course of your symptoms.  Sinus infections are not as easily transmitted as other respiratory infection, however we still recommend that you avoid close contact with loved ones, especially the very young and elderly.  Remember to wash your hands thoroughly throughout the day as this is the number one way to prevent the spread of infection!  Home Care:  Only take medications as instructed by your medical team.  Do not take these medications with alcohol.  A steam or ultrasonic humidifier can help congestion.  You can place a towel over your head and breathe in the steam from hot water coming from a faucet.  Avoid close contacts especially the very young and the elderly.  Cover your mouth when you cough or sneeze.  Always remember to wash your hands.  Get Help Right Away If:  You develop worsening fever or sinus pain.  You develop a severe head ache or visual changes.  Your symptoms persist after you have completed your treatment plan.  Make sure you  Understand these instructions.  Will watch your condition.  Will get help right away if you  are not doing well or get worse.  Your e-visit answers were reviewed by a board certified advanced clinical practitioner to complete your personal care plan.  Depending on the condition, your plan could have included both over the counter or prescription medications.  If there is a problem please reply  once you have received a response from your provider.  Your safety is important to [place].  If you have drug allergies check your prescription carefully.    You can use MyChart to ask questions about  today's visit, request a non-urgent call back, or ask for a work or school excuse for 24 hours related to this e-Visit. If it has been greater than 24 hours you will need to follow up with your provider, or enter a new e-Visit to address those concerns.  You will get an e-mail in the next two days asking about your experience.  I hope that your e-visit has been valuable and will speed your recovery. Thank you for using e-visits.   Greater than 5 minutes, yet less than 10 minutes of time have been spent researching, coordinating and implementing care for this patient today.

## 2019-07-30 ENCOUNTER — Telehealth: Payer: Managed Care, Other (non HMO)

## 2019-07-30 ENCOUNTER — Emergency Department (INDEPENDENT_AMBULATORY_CARE_PROVIDER_SITE_OTHER)
Admission: EM | Admit: 2019-07-30 | Discharge: 2019-07-30 | Disposition: A | Discharge status: Home / Self Care | Payer: Managed Care, Other (non HMO) | Source: Home / Self Care

## 2019-07-30 ENCOUNTER — Emergency Department: Admission: EM | Admit: 2019-07-30 | Discharge: 2019-07-30 | Discharge status: Absent without leave | Payer: Self-pay

## 2019-07-30 ENCOUNTER — Other Ambulatory Visit: Payer: Self-pay

## 2019-07-30 DIAGNOSIS — J209 Acute bronchitis, unspecified: Secondary | ICD-10-CM | POA: Diagnosis not present

## 2019-07-30 MED ORDER — AZITHROMYCIN 250 MG PO TABS: ORAL_TABLET | ORAL | 0 refills | Status: AC

## 2019-07-30 NOTE — Discharge Instructions (Signed)
Return if any problems.

## 2019-07-30 NOTE — ED Triage Notes (Signed)
Pt c/o heavy congestion and wheezing as well as chest tightness x 3 days. Tested neg for COVID on Tues. Says she was pos covid back in OCT. Also says she is coughing up yellow mucous at times. Hx of bronchitis in past years.

## 2019-07-31 NOTE — ED Provider Notes (Signed)
[facility] C[facility]    CSN: [id number] Arrival date & time: 07/30/19  1709      History   Chief Complaint Chief Complaint  Patient presents with  . Nasal Congestion  . Wheezing  . Chest tightness    HPI Emily Flowers is a 23 y.o. female.   The history is provided by the patient. No language interpreter was used.  Wheezing Severity:  Moderate Severity compared to prior episodes:  Similar Onset quality:  Gradual Timing:  Constant Progression:  Worsening Chronicity:  New Worsened by:  Nothing Ineffective treatments:  None tried Associated symptoms: shortness of breath   Associated symptoms: no chest pain    Pt reports she had covid in October.  Pt feels like she has bronchitis   Past Medical History:  Diagnosis Date  . Acne   . Heart murmur     Patient Active Problem List   Diagnosis Date Noted  . Allergic rhinitis 10/08/2017  . 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 WISDOM TOOTH EXTRACTION      OB History    Gravida  0   Para  0   Term  0   Preterm  0   AB  0   Living  0     SAB  0   TAB  0   Ectopic  0   Multiple  0   Live Births  0            Home Medications    Prior to Admission medications   Medication Sig Start Date End Date Taking? Authorizing Provider  azithromycin (ZITHROMAX Z-PAK) 250 MG tablet Take 2 tablets (500 mg) on  Day 1,  followed by 1 tablet (250 mg) once daily on Days 2 through 5. 07/30/19 08/04/19  Fransico Meadow, PA-C  Cetirizine HCl 10 MG CAPS Take by mouth.    [provider]  ipratropium (ATROVENT) 0.03 % nasal spray Place 2 sprays into both nostrils 2 (two) times daily. 07/28/19   McVey, Gelene Mink, PA-C  Multiple Vitamins-Minerals (MULTIVITAMIN WITH MINERALS) tablet Take 1 tablet by mouth daily.    [provider]  norelgestromin-ethinyl estradiol (ORTHO EVRA) 150-35 MCG/24HR transdermal patch Place 1 patch onto the skin once a  week. 05/18/19   Emily Filbert, MD    Family History Family History  Problem Relation Age of Onset  . Diabetes Father   . Cancer Maternal Aunt        breast  . Diabetes Paternal Aunt   . Diabetes Paternal Uncle     Social History Social History   Tobacco Use  . Smoking status: Never Smoker  . Smokeless tobacco: Never Used  Substance Use Topics  . Alcohol use: No    Alcohol/week: 0.0 standard drinks  . Drug use: No     Allergies   Banana, Penicillins, and Sulfa antibiotics   Review of Systems Review of Systems  Respiratory: Positive for shortness of breath and wheezing.   Cardiovascular: Negative for chest pain.  All other systems reviewed and are negative.    Physical Exam Triage Vital Signs ED Triage Vitals  Enc Vitals Group     BP 07/30/19 1733 (!) 145/84     Pulse Rate 07/30/19 1733 94     Resp 07/30/19 1733 18     Temp 07/30/19 1733 98.6 F (37 C)     Temp Source 07/30/19 1733 Oral     SpO2  07/30/19 1733 100 %     Weight 07/30/19 1734 130 lb 1.1 oz (59 kg)     Height 07/30/19 1734 5\' 6"  (1.676 m)     Head Circumference --      Peak Flow --      Pain Score 07/30/19 1734 0     Pain Loc --      Pain Edu? --      Excl. in [place]? --    No data found.  Updated Vital Signs BP (!) 145/84 (BP Location: Left Arm)   Pulse 94   Temp 98.6 F (37 C) (Oral)   Resp 18   Ht 5\' 6"  (1.676 m)   Wt 59 kg   LMP 07/21/2019 (Exact Date)   SpO2 100%   BMI 20.99 kg/m   Visual Acuity Right Eye Distance:   Left Eye Distance:   Bilateral Distance:    Right Eye Near:   Left Eye Near:    Bilateral Near:     Physical Exam Vitals signs and nursing note reviewed.  Constitutional:      Appearance: She is well-developed.  HENT:     Head: Normocephalic.     Right Ear: Tympanic membrane normal.     Left Ear: Tympanic membrane normal.     Nose: Nose normal.     Mouth/Throat:     Mouth: Mucous membranes are moist.  Neck:     Musculoskeletal: Normal range of  motion.  Cardiovascular:     Rate and Rhythm: Normal rate.  Pulmonary:     Effort: Pulmonary effort is normal.  Abdominal:     General: There is no distension.  Musculoskeletal: Normal range of motion.  Neurological:     Mental Status: She is alert and oriented to person, place, and time.      UC Treatments / Results  Labs (all labs ordered are listed, but only abnormal results are displayed) Labs Reviewed - No data to display  EKG   Radiology No results found.  Procedures Procedures (including critical care time)  Medications Ordered in UC Medications - No data to display  Initial Impression / Assessment and Plan / UC Course  I have reviewed the triage vital signs and the nursing notes.  Pertinent labs & imaging results that were available during my care of the patient were reviewed by me and considered in my medical decision making (see chart for details).     MDM   Pt reports she usually gets well better with zithromax.   Final Clinical Impressions(s) / UC Diagnoses   Final diagnoses:  Acute bronchitis, unspecified organism     Discharge Instructions     Return if any problems    ED Prescriptions    Medication Sig Dispense Auth. Provider   azithromycin (ZITHROMAX Z-PAK) 250 MG tablet Take 2 tablets (500 mg) on  Day 1,  followed by 1 tablet (250 mg) once daily on Days 2 through 5. 6 each Fransico Meadow, PA-C     PDMP not reviewed this encounter.  An After Visit Summary was printed and given to the patient.    Fransico Meadow, [place] 07/31/19 1457

## 2019-08-27 ENCOUNTER — Telehealth: Payer: Managed Care, Other (non HMO) | Admitting: Emergency Medicine

## 2019-08-27 DIAGNOSIS — R05 Cough: Secondary | ICD-10-CM

## 2019-08-27 DIAGNOSIS — R059 Cough, unspecified: Secondary | ICD-10-CM

## 2019-08-27 MED ORDER — BENZONATATE 100 MG PO CAPS: 100.0000 mg | ORAL_CAPSULE | Freq: Two times a day (BID) | ORAL | 0 refills | Status: DC | PRN

## 2019-08-27 NOTE — Progress Notes (Signed)
We are sorry that you are not feeling well.  Here is how we plan to help!  It is hard to say for certain what is causing your cough.  You would likely benefit from a chest x-ray.  However, without running a fever, I think it is less likely that you have pneumonia.  You could have redevelopment of your bronchitis.  There are also several other things that could be causing your cough, such as allergies, reflux, or viruses.  I will prescribe you some cough medicine that you can try for the next couple of days.  I would also recommend taking an antihistamine such as Zyrtec or Claritin.  If you begin to run a fever, or have worsening productive cough, I recommend that you be seen in person and have a formal work-up and chest x-ray performed.   In addition you may use A prescription cough medication called Tessalon Perles 100mg . You may take 1-2 capsules every 8 hours as needed for your cough.    From your responses in the eVisit questionnaire you describe inflammation in the upper respiratory tract which is causing a significant cough.  This is commonly called Bronchitis and has four common causes:    Allergies  Viral Infections  Acid Reflux  Bacterial Infection Allergies, viruses and acid reflux are treated by controlling symptoms or eliminating the cause. An example might be a cough caused by taking certain blood pressure medications. You stop the cough by changing the medication. Another example might be a cough caused by acid reflux. Controlling the reflux helps control the cough.  USE OF BRONCHODILATOR ("RESCUE") INHALERS: There is a risk from using your bronchodilator too frequently.  The risk is that over-reliance on a medication which only relaxes the muscles surrounding the breathing tubes can reduce the effectiveness of medications prescribed to reduce swelling and congestion of the tubes themselves.  Although you feel brief relief from the bronchodilator inhaler, your asthma may actually  be worsening with the tubes becoming more swollen and filled with mucus.  This can delay other crucial treatments, such as oral steroid medications. If you need to use a bronchodilator inhaler daily, several times per day, you should discuss this with your provider.  There are probably better treatments that could be used to keep your asthma under control.     HOME CARE . Only take medications as instructed by your medical team. . Complete the entire course of an antibiotic. . Drink plenty of fluids and get plenty of rest. . Avoid close contacts especially the very young and the elderly . Cover your mouth if you cough or cough into your sleeve. . Always remember to wash your hands . A steam or ultrasonic humidifier can help congestion.   GET HELP RIGHT AWAY IF: . You develop worsening fever. . You become short of breath . You cough up blood. . Your symptoms persist after you have completed your treatment plan MAKE SURE YOU   Understand these instructions.  Will watch your condition.  Will get help right away if you are not doing well or get worse.  Your e-visit answers were reviewed by a board certified advanced clinical practitioner to complete your personal care plan.  Depending on the condition, your plan could have included both over the counter or prescription medications. If there is a problem please reply  once you have received a response from your provider. Your safety is important to [place].  If you have drug allergies check your prescription carefully.  You can use MyChart to ask questions about today's visit, request a non-urgent call back, or ask for a work or school excuse for 24 hours related to this e-Visit. If it has been greater than 24 hours you will need to follow up with your provider, or enter a new e-Visit to address those concerns. You will get an e-mail in the next two days asking about your experience.  I hope that your e-visit has been valuable and will speed  your recovery. Thank you for using e-visits.  Greater than 5 minutes, yet less than 10 minutes was used in reviewing the patient's chart, questionnaire, prescribing medications, and documentation for this visit.

## 2019-09-03 ENCOUNTER — Encounter: Payer: Self-pay | Admitting: Emergency Medicine

## 2019-09-03 ENCOUNTER — Emergency Department (INDEPENDENT_AMBULATORY_CARE_PROVIDER_SITE_OTHER): Payer: Managed Care, Other (non HMO)

## 2019-09-03 ENCOUNTER — Emergency Department
Admission: EM | Admit: 2019-09-03 | Discharge: 2019-09-03 | Disposition: A | Discharge status: Home / Self Care | Payer: Managed Care, Other (non HMO) | Source: Home / Self Care | Attending: Family Medicine | Admitting: Family Medicine

## 2019-09-03 ENCOUNTER — Other Ambulatory Visit: Payer: Self-pay

## 2019-09-03 DIAGNOSIS — M940 Chondrocostal junction syndrome [Tietze]: Secondary | ICD-10-CM | POA: Diagnosis not present

## 2019-09-03 DIAGNOSIS — R079 Chest pain, unspecified: Secondary | ICD-10-CM

## 2019-09-03 NOTE — Discharge Instructions (Addendum)
Apply ice pack for 20 to 30 minutes, 3 to 4 times daily  Continue until pain decreases.  May take Ibuprofen 200mg , 4 tabs every 8 hours with food.

## 2019-09-03 NOTE — ED Provider Notes (Signed)
[facility]    CSN: [id number] Arrival date & time: 09/03/19  1715      History   Chief Complaint Chief Complaint  Patient presents with  . Chest tightness    HPI Emily Flowers is a 24 y.o. female.   Patient developed tightness in her anterior chest yesterday without shortness of breath.  The pain is worse with respiration and movemeent.  She had some nausea and mild abdominal pain four days ago that resolved with Zofran.  She denies fevers, chills, and sweats.  Patient was treated for bronchitis about  3 weeks ago with a Z-pak and inhaler.  She recovered from a COVID19  Infection 3 months ago.  The history is provided by the patient.    Past Medical History:  Diagnosis Date  . Acne   . Heart murmur     Patient Active Problem List   Diagnosis Date Noted  . Allergic rhinitis 10/08/2017  . 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 WISDOM TOOTH EXTRACTION      OB History    Gravida  0   Para  0   Term  0   Preterm  0   AB  0   Living  0     SAB  0   TAB  0   Ectopic  0   Multiple  0   Live Births  0            Home Medications    Prior to Admission medications   Medication Sig Start Date End Date Taking? Authorizing Provider  benzonatate (TESSALON) 100 MG capsule Take 1 capsule (100 mg total) by mouth 2 (two) times daily as needed for cough. 08/27/19  Yes Montine Circle, PA-C  Cetirizine HCl 10 MG CAPS Take by mouth.   Yes [provider]  ipratropium (ATROVENT) 0.03 % nasal spray Place 2 sprays into both nostrils 2 (two) times daily. 07/28/19  Yes McVey, Gelene Mink, PA-C  Multiple Vitamins-Minerals (MULTIVITAMIN WITH MINERALS) tablet Take 1 tablet by mouth daily.   Yes [provider]  norelgestromin-ethinyl estradiol (ORTHO EVRA) 150-35 MCG/24HR transdermal patch Place 1 patch onto the skin once a week. 05/18/19  Yes Emily Filbert, MD    Family  History Family History  Problem Relation Age of Onset  . Diabetes Father   . Cancer Maternal Aunt        breast  . Diabetes Paternal Aunt   . Diabetes Paternal Uncle     Social History Social History   Tobacco Use  . Smoking status: Never Smoker  . Smokeless tobacco: Never Used  Substance Use Topics  . Alcohol use: No    Alcohol/week: 0.0 standard drinks  . Drug use: No     Allergies   Banana, Penicillins, and Sulfa antibiotics   Review of Systems Review of Systems No sore throat No cough No pleuritic pain but has tightness in her anterior chest. No wheezing No nasal congestion No post-nasal drainage No sinus pain/pressure No itchy/red eyes No earache No hemoptysis No SOB No fever/chills + nausea, resolved No vomiting No abdominal pain No diarrhea No urinary symptoms No skin rash No fatigue No myalgias No headache   Physical Exam Triage Vital Signs ED Triage Vitals  Enc Vitals Group     BP 09/03/19 1750 119/66     Pulse Rate 09/03/19 1750 81     Resp --  Temp 09/03/19 1750 99.1 F (37.3 C)     Temp Source 09/03/19 1750 Oral     SpO2 09/03/19 1750 100 %     Weight 09/03/19 1754 136 lb 8 oz (61.9 kg)     Height 09/03/19 1754 5\' 6"  (1.676 m)     Head Circumference --      Peak Flow --      Pain Score 09/03/19 1753 8     Pain Loc --      Pain Edu? --      Excl. in [place]? --    No data found.  Updated Vital Signs BP 119/66 (BP Location: Left Arm)   Pulse 81   Temp 99.1 F (37.3 C) (Oral)   Ht 5\' 6"  (1.676 m)   Wt 61.9 kg   LMP 08/19/2019 (Exact Date)   SpO2 100%   BMI 22.03 kg/m   Visual Acuity Right Eye Distance:   Left Eye Distance:   Bilateral Distance:    Right Eye Near:   Left Eye Near:    Bilateral Near:     Physical Exam Chest:       Comments: Chest:  Distinct tenderness to palpation over the inferior sternum as noted on diagram.    Nursing notes and Vital Signs reviewed. Appearance:  Patient appears stated age,  and in no acute distress Eyes:  Pupils are equal, round, and reactive to light and accomodation.  Extraocular movement is intact.  Conjunctivae are not inflamed  Ears:  Canals normal.  Tympanic membranes normal.  Nose:  Normal turbinates.  No sinus tenderness. Pharynx:  Normal Neck:  Supple. No adenopathy. Lungs:  Clear to auscultation.  Breath sounds are equal.  Moving air well. Chest:  See diagram above. Heart:  Regular rate and rhythm without murmurs, rubs, or gallops.  Abdomen:  Nontender without masses or hepatosplenomegaly.  Bowel sounds are present.  No CVA or flank tenderness.  Extremities:  No edema.  No calf swelling or tenderness to palpation.  Skin:  No rash present.    UC Treatments / Results  Labs (all labs ordered are listed, but only abnormal results are displayed) Labs Reviewed - No data to display  EKG   Radiology DG Chest 2 View  Result Date: 09/03/2019 CLINICAL DATA:  Substernal chest pain EXAM: CHEST - 2 VIEW COMPARISON:  03/30/2015 FINDINGS: The heart size and mediastinal contours are within normal limits. Both lungs are clear. The visualized skeletal structures are unremarkable. IMPRESSION: No active cardiopulmonary disease. Electronically Signed   By: Davina Poke D.O.   On: 09/03/2019 18:46    Procedures Procedures (including critical care time)  Medications Ordered in UC Medications - No data to display  Initial Impression / Assessment and Plan / UC Course  I have reviewed the triage vital signs and the nursing notes.  Pertinent labs & imaging results that were available during my care of the patient were reviewed by me and considered in my medical decision making (see chart for details).    Suspect early viral illness with costochondritis.  Note that patient has had a previous COVID19 infection. Followup with Family Doctor if not improved in one week.   Final Clinical Impressions(s) / UC Diagnoses   Final diagnoses:  Costochondritis      Discharge Instructions     Apply ice pack for 20 to 30 minutes, 3 to 4 times daily  Continue until pain decreases.  May take Ibuprofen 200mg , 4 tabs every 8 hours with food.  ED Prescriptions    None        Kandra Nicolas, MD 09/05/19 1413

## 2019-09-03 NOTE — ED Triage Notes (Signed)
Patient dx with acute bronchitis about 3 weeks, given a z-pack, inhaler, pt did get somewhat better, now patient is having a hard time taking a deep breath.  It hurts and the pain radiates to her back.

## 2019-09-18 ENCOUNTER — Encounter: Payer: Self-pay | Admitting: *Deleted

## 2019-09-21 ENCOUNTER — Telehealth: Payer: Self-pay | Admitting: Family Medicine

## 2019-09-21 NOTE — Telephone Encounter (Signed)
Can you please check on this? She has been seen by Simona Huh in the past however, she has since been being seen at [facility].  It doesn't appear she is assigned to [facility] anymore?? Thanks

## 2019-09-21 NOTE — Telephone Encounter (Signed)
Copied from [place] [phone]. Topic: General - Other >> Sep 21, 2019  8:14 AM Keene Breath wrote: Reason for CRM: Patient would like an in-office visit because of her diagnosis of IBS.  She has had COVID in October of 2020, but has no other symptoms except diarrhea because of the IBS.  Patient stated that she does not want a video visit and wants to come into the office.  Please call patient to schedule with any provider who is available as soon as possible.  CB# [phone]

## 2019-10-21 NOTE — Progress Notes (Signed)
Emily Flowers  MRN: [id number] DOB: 1996-06-24  Subjective:  HPI   The patient is a 24 year old female who presents with complaint of Irritable Bowel Syndrome. Patient states she was diagnosed with IBS at the Urgent Care approximately 08/31/2019. She states depending on what she eats she will have a flare. She is experiencing constipation.     For Health maintenance the patient is due for HIV screening  Patient Active Problem List   Diagnosis Date Noted  . Allergic rhinitis 10/08/2017  . 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 Acne   . Heart murmur    Past Surgical History:  Procedure Laterality Date  . WISDOM TOOTH EXTRACTION      Social History   Socioeconomic History  . Marital status: Single    Spouse name: Not on file  . Number of children: Not on file  . Years of education: Not on file  . Highest education level: Not on file  Occupational History  . Not on file  Tobacco Use  . Smoking status: Never Smoker  . Smokeless tobacco: Never Used  Substance and Sexual Activity  . Alcohol use: No    Alcohol/week: 0.0 standard drinks  . Drug use: No  . Sexual activity: Not Currently    Partners: Male    Birth control/protection: Condom    Comment: orsynthia  Other Topics Concern  . Not on file  Social History Narrative  . Not on file   Social Determinants of Health   Financial Resource Strain:   . Difficulty of Paying Living Expenses: Not on file  Food Insecurity:   . Worried About Charity fundraiser in the Last Year: Not on file  . Ran Out of Food in the Last Year: Not on file  Transportation Needs:   . Lack of Transportation (Medical): Not on file  . Lack of Transportation (Non-Medical): Not on file  Physical Activity:   . Days of Exercise per Week: Not on file  . Minutes of Exercise per Session: Not on file  Stress:   . Feeling of Stress : Not on file  Social Connections:   . Frequency of Communication with  Friends and Family: Not on file  . Frequency of Social Gatherings with Friends and Family: Not on file  . Attends Religious Services: Not on file  . Active Member of Clubs or Organizations: Not on file  . Attends Archivist Meetings: Not on file  . Marital Status: Not on file  Intimate Partner Violence:   . Fear of Current or Ex-Partner: Not on file  . Emotionally Abused: Not on file  . Physically Abused: Not on file  . Sexually Abused: Not on file    Outpatient Encounter Medications as of 10/22/2019  Medication Sig  . Cetirizine HCl 10 MG CAPS Take by mouth.  . fluticasone (FLONASE) 50 MCG/ACT nasal spray Place into both nostrils daily.  . Multiple Vitamins-Minerals (MULTIVITAMIN WITH MINERALS) tablet Take 1 tablet by mouth daily.  . [DISCONTINUED] ipratropium (ATROVENT) 0.03 % nasal spray Place 2 sprays into both nostrils 2 (two) times daily.  . [DISCONTINUED] benzonatate (TESSALON) 100 MG capsule Take 1 capsule (100 mg total) by mouth 2 (two) times daily as needed for cough.  . [DISCONTINUED] norelgestromin-ethinyl estradiol (ORTHO EVRA) 150-35 MCG/24HR transdermal patch Place 1 patch onto the skin once a week.   No facility-administered encounter medications on file as of 10/22/2019.    Allergies  Allergen Reactions  . Banana Swelling    Throat swells  . Penicillins Hives  . Sulfa Antibiotics     Review of Systems  Constitutional: Negative.   Cardiovascular: Negative.   Genitourinary: Negative.     Objective:  BP 114/70 (BP Location: Right Arm, Patient Position: Sitting, Cuff Size: Normal)   Pulse 86   Temp (!) 97.5 F (36.4 C) (Temporal)   Wt 131 lb 3.2 oz (59.5 kg)   BMI 21.18 kg/m   LMP- 2 weeks ago.  Physical Exam  Constitutional: She is oriented to person, place, and time and well-developed, well-nourished, and in no distress.  HENT:  Head: Normocephalic.  Eyes: Conjunctivae are normal.  Cardiovascular: Normal rate.  Pulmonary/Chest: Effort  normal and breath sounds normal.  Abdominal: Soft.  Slight generalized tenderness - more prominent in the left abdomen. No masses. BS quiet. No tympany, guarding or rigidity.  Musculoskeletal:        General: Normal range of motion.     Cervical back: Neck supple.  Neurological: She is alert and oriented to person, place, and time.  Skin: No rash noted.  Psychiatric: Mood, affect and judgment normal.    Assessment and Plan :   1. Irritable bowel syndrome with both constipation and diarrhea Intermittent episodes of abdominal discomfort with alternating constipation and diarrhea since the first of the year. Some tenderness across abdomen recently. Will give Bentyl to control spasms. Recommend bland diet and check CBC with CMP. May need referral to GI if persistent or uncontrolled. - dicyclomine (BENTYL) 10 MG capsule; Take 1 capsule (10 mg total) by mouth 4 (four) times daily -  before meals and at bedtime.  Dispense: 30 capsule; Refill: 0 - CBC with Differential/Platelet - Comprehensive metabolic panel  2. Screening for HIV (human immunodeficiency virus) - HIV Antibody (routine testing w rflx)

## 2019-10-22 ENCOUNTER — Ambulatory Visit (INDEPENDENT_AMBULATORY_CARE_PROVIDER_SITE_OTHER): Payer: Managed Care, Other (non HMO) | Admitting: Family Medicine

## 2019-10-22 ENCOUNTER — Other Ambulatory Visit: Payer: Self-pay

## 2019-10-22 ENCOUNTER — Encounter: Payer: Self-pay | Admitting: Family Medicine

## 2019-10-22 VITALS — BP 114/70 | HR 86 | Temp 97.5°F | Wt 131.2 lb

## 2019-10-22 DIAGNOSIS — K582 Mixed irritable bowel syndrome: Secondary | ICD-10-CM

## 2019-10-22 DIAGNOSIS — Z114 Encounter for screening for human immunodeficiency virus [HIV]: Secondary | ICD-10-CM

## 2019-10-22 MED ORDER — DICYCLOMINE HCL 10 MG PO CAPS: 10.0000 mg | ORAL_CAPSULE | Freq: Three times a day (TID) | ORAL | 0 refills | Status: DC

## 2019-10-22 NOTE — Patient Instructions (Signed)

## 2019-10-23 ENCOUNTER — Telehealth: Payer: Self-pay

## 2019-10-23 LAB — COMPREHENSIVE METABOLIC PANEL
ALT: 36 IU/L — ABNORMAL HIGH (ref 0–32)
AST: 28 IU/L (ref 0–40)
Albumin/Globulin Ratio: 2 (ref 1.2–2.2)
Albumin: 4.7 g/dL (ref 3.9–5.0)
Alkaline Phosphatase: 37 IU/L — ABNORMAL LOW (ref 39–117)
BUN/Creatinine Ratio: 9 (ref 9–23)
BUN: 9 mg/dL (ref 6–20)
Bilirubin Total: 0.9 mg/dL (ref 0.0–1.2)
CO2: 21 mmol/L (ref 20–29)
Calcium: 9.6 mg/dL (ref 8.7–10.2)
Chloride: 105 mmol/L (ref 96–106)
Creatinine, Ser: 0.98 mg/dL (ref 0.57–1.00)
GFR calc Af Amer: 93 mL/min/{1.73_m2} (ref >59)
GFR calc non Af Amer: 80 mL/min/{1.73_m2} (ref >59)
Globulin, Total: 2.4 g/dL (ref 1.5–4.5)
Glucose: 81 mg/dL (ref 65–99)
Potassium: 4.1 mmol/L (ref 3.5–5.2)
Sodium: 138 mmol/L (ref 134–144)
Total Protein: 7.1 g/dL (ref 6.0–8.5)

## 2019-10-23 LAB — CBC WITH DIFFERENTIAL/PLATELET
Basophils Absolute: 0 10*3/uL (ref 0.0–0.2)
Basos: 1 %
EOS (ABSOLUTE): 0.1 10*3/uL (ref 0.0–0.4)
Eos: 5 %
Hematocrit: 39.1 % (ref 34.0–46.6)
Hemoglobin: 13.2 g/dL (ref 11.1–15.9)
Immature Grans (Abs): 0 10*3/uL (ref 0.0–0.1)
Immature Granulocytes: 0 %
Lymphocytes Absolute: 1.6 10*3/uL (ref 0.7–3.1)
Lymphs: 55 %
MCH: 31 pg (ref 26.6–33.0)
MCHC: 33.8 g/dL (ref 31.5–35.7)
MCV: 92 fL (ref 79–97)
Monocytes Absolute: 0.3 10*3/uL (ref 0.1–0.9)
Monocytes: 9 %
Neutrophils Absolute: 0.9 10*3/uL — ABNORMAL LOW (ref 1.4–7.0)
Neutrophils: 30 %
Platelets: 177 10*3/uL (ref 150–450)
RBC: 4.26 x10E6/uL (ref 3.77–5.28)
RDW: 12.2 % (ref 11.7–15.4)
WBC: 2.9 10*3/uL — ABNORMAL LOW (ref 3.4–10.8)

## 2019-10-23 LAB — HIV ANTIBODY (ROUTINE TESTING W REFLEX): HIV Screen 4th Generation wRfx: NONREACTIVE

## 2019-10-23 NOTE — Telephone Encounter (Signed)
Attempted to reach patient but phone would be answered and then disconnected

## 2019-10-23 NOTE — Telephone Encounter (Signed)
-----   Message from Margo Common, [place] sent at 10/23/2019  8:19 AM EST ----- All tests essentially normal except slightly low WBC count as usually occurs during a viral illness. Try the medication for irritable bowel/spastic colon syndrome. Recheck blood counts in a month. Drink plenty of water with diet.

## 2019-10-26 ENCOUNTER — Other Ambulatory Visit: Payer: Self-pay

## 2019-10-26 DIAGNOSIS — D72819 Decreased white blood cell count, unspecified: Secondary | ICD-10-CM

## 2019-10-26 NOTE — Telephone Encounter (Signed)
Advised 

## 2019-11-26 ENCOUNTER — Telehealth: Payer: Self-pay

## 2019-11-26 LAB — CBC WITH DIFFERENTIAL/PLATELET
Basophils Absolute: 0 10*3/uL (ref 0.0–0.2)
Basos: 1 %
EOS (ABSOLUTE): 0.1 10*3/uL (ref 0.0–0.4)
Eos: 4 %
Hematocrit: 37.1 % (ref 34.0–46.6)
Hemoglobin: 12.7 g/dL (ref 11.1–15.9)
Immature Grans (Abs): 0 10*3/uL (ref 0.0–0.1)
Immature Granulocytes: 0 %
Lymphocytes Absolute: 1.5 10*3/uL (ref 0.7–3.1)
Lymphs: 43 %
MCH: 31.4 pg (ref 26.6–33.0)
MCHC: 34.2 g/dL (ref 31.5–35.7)
MCV: 92 fL (ref 79–97)
Monocytes Absolute: 0.3 10*3/uL (ref 0.1–0.9)
Monocytes: 10 %
Neutrophils Absolute: 1.5 10*3/uL (ref 1.4–7.0)
Neutrophils: 42 %
Platelets: 197 10*3/uL (ref 150–450)
RBC: 4.04 x10E6/uL (ref 3.77–5.28)
RDW: 12.4 % (ref 11.7–15.4)
WBC: 3.5 10*3/uL (ref 3.4–10.8)

## 2019-11-26 NOTE — Telephone Encounter (Signed)
-----   Message from [place], [place] sent at 11/26/2019 11:00 AM EDT ----- White blood cell count back to normal. No signs of infection or anemia. Will check annually.

## 2019-11-26 NOTE — Telephone Encounter (Signed)
Patient advised on lab results. 

## 2019-12-09 ENCOUNTER — Other Ambulatory Visit: Payer: Self-pay

## 2019-12-09 ENCOUNTER — Emergency Department
Admission: EM | Admit: 2019-12-09 | Discharge: 2019-12-09 | Disposition: A | Discharge status: Home / Self Care | Payer: Managed Care, Other (non HMO) | Source: Home / Self Care | Attending: Family Medicine | Admitting: Family Medicine

## 2019-12-09 ENCOUNTER — Ambulatory Visit: Payer: Self-pay | Admitting: Family Medicine

## 2019-12-09 ENCOUNTER — Encounter: Payer: Self-pay | Admitting: Emergency Medicine

## 2019-12-09 DIAGNOSIS — M79661 Pain in right lower leg: Secondary | ICD-10-CM | POA: Diagnosis not present

## 2019-12-09 DIAGNOSIS — M79662 Pain in left lower leg: Secondary | ICD-10-CM

## 2019-12-09 LAB — CK: Total CK: 80 U/L (ref 29–143)

## 2019-12-09 LAB — D-DIMER, QUANTITATIVE: D-Dimer, Quant: <0.19 mcg/mL FEU (ref <0.50)

## 2019-12-09 NOTE — ED Triage Notes (Signed)
Bilateral leg pain started 1 week ago exactly 1 week after 2nd Pfizer dose - fatigue w/ 2nd shot No swelling to BLE Pain is behind R knee/calf Pain is by left ankle COVID + Oct 2020 - fatigue & loss of taste & smell - all resolved

## 2019-12-09 NOTE — Telephone Encounter (Signed)
Pt reports had 2nd pfizer vaccine 11/25/2019. States approximately 1 week after experiencing ""Throbbing area at random places in my legs, back of right knee towards shin and left ankle area."  States "Lasts for about 1 minute" occurs daily, 6/10 intensity when occurs. Also repots SOB at rest, mild-moderate. States has been anxious. Reports HR several days ago 165 at rest, Sat 80% "But I was anxious and had been crying so when I calmed down sat was 98%."  Pt requesting appt today; covid screening indicates virtual appt. Pt verbalizes understanding.  Pt states she will go to UC.  Assured NT would route to practice for PCP's review. Reason for Disposition . [1] MODERATE pain (e.g., interferes with normal activities, limping) AND [2] present > 3 days  Answer Assessment - Initial Assessment Questions 1. ONSET: "When did the pain start?"      2 weeks ago after 2nd pfizer vaccine 2. LOCATION: "Where is the pain located?"     "throbbing" "Random places on both legs" 3. PAIN: "How bad is the pain?"    (Scale 1-10; or mild, moderate, severe)   -  MILD (1-3): doesn't interfere with normal activities    -  MODERATE (4-7): interferes with normal activities (e.g., work or school) or awakens from sleep, limping    -  SEVERE (8-10): excruciating pain, unable to do any normal activities, unable to walk     6/10 4. WORK OR EXERCISE: "Has there been any recent work or exercise that involved this part of the body?"      no 5. CAUSE: "What do you think is causing the leg pain?"     Pfizer vaccine? 6. OTHER SYMPTOMS: "Do you have any other symptoms?" (e.g., chest pain, back pain, breathing difficulty, swelling, rash, fever, numbness, weakness)     SOB at times at rest 7. PREGNANCY: "Is there any chance you are pregnant?" "When was your last menstrual period?"    no  Protocols used: LEG PAIN-A-AH

## 2019-12-09 NOTE — Discharge Instructions (Addendum)
May take ibuprofen as needed.

## 2019-12-12 NOTE — ED Provider Notes (Signed)
[facility]    CSN: [id number] Arrival date & time: 12/09/19  1452      History   Chief Complaint Chief Complaint  Patient presents with  . Leg Pain    HPI Emily Flowers is a 24 y.o. female.   Patient received her second [place] vaccine two weeks ago, and had mild fatigue afterwards.  One week ago she developed mild bilateral leg pain:  Right calf and popliteal area, and left ankle.  She denies chest tightness and shortness of breath.   The history is provided by the patient.  Leg Pain Location:  Leg and ankle Time since incident:  1 week Injury: no   Leg location:  R upper leg Ankle location:  L ankle Pain details:    Quality:  Aching   Radiates to:  Does not radiate   Severity:  Mild   Onset quality:  Sudden   Duration:  1 week   Timing:  Constant   Progression:  Improving Chronicity:  New Prior injury to area:  No Relieved by:  None tried Worsened by:  Bearing weight Ineffective treatments:  None tried Associated symptoms: no decreased ROM, no fatigue, no fever, no muscle weakness, no numbness, no stiffness, no swelling and no tingling     Past Medical History:  Diagnosis Date  . Acne   . Heart murmur     Patient Active Problem List   Diagnosis Date Noted  . Allergic rhinitis 10/08/2017  . 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 WISDOM TOOTH EXTRACTION      OB History    Gravida  0   Para  0   Term  0   Preterm  0   AB  0   Living  0     SAB  0   TAB  0   Ectopic  0   Multiple  0   Live Births  0            Home Medications    Prior to Admission medications   Medication Sig Start Date End Date Taking? Authorizing Provider  Cetirizine HCl 10 MG CAPS Take by mouth.   Yes [provider]  dicyclomine (BENTYL) 10 MG capsule Take 1 capsule (10 mg total) by mouth 4 (four) times daily -  before meals and at bedtime. 10/22/19   Chrismon, Vickki Muff, PA    fluticasone (FLONASE) 50 MCG/ACT nasal spray Place into both nostrils daily.    [provider]  Multiple Vitamins-Minerals (MULTIVITAMIN WITH MINERALS) tablet Take 1 tablet by mouth daily.    [provider]    Family History Family History  Problem Relation Age of Onset  . Diabetes Father   . Cancer Maternal Aunt        breast  . Diabetes Paternal Aunt   . Diabetes Paternal Uncle     Social History Social History   Tobacco Use  . Smoking status: Never Smoker  . Smokeless tobacco: Never Used  Substance Use Topics  . Alcohol use: No    Alcohol/week: 0.0 standard drinks  . Drug use: No     Allergies   Banana, Penicillins, and Sulfa antibiotics   Review of Systems Review of Systems  Constitutional: Negative for chills, diaphoresis, fatigue and fever.  HENT: Negative.   Eyes: Negative.   Respiratory: Negative for cough, chest tightness, shortness of breath, wheezing and stridor.   Cardiovascular: Negative for chest  pain, palpitations and leg swelling.  Gastrointestinal: Negative.   Genitourinary: Negative.   Musculoskeletal: Negative for stiffness.       Leg pain  Skin: Negative.   Neurological: Negative.   All other systems reviewed and are negative.    Physical Exam Triage Vital Signs ED Triage Vitals  Enc Vitals Group     BP 12/09/19 1509 126/85     Pulse Rate 12/09/19 1509 68     Resp 12/09/19 1509 17     Temp 12/09/19 1509 99.1 F (37.3 C)     Temp Source 12/09/19 1509 Oral     SpO2 12/09/19 1509 100 %     Weight 12/09/19 1515 125 lb (56.7 kg)     Height 12/09/19 1515 5\' 6"  (1.676 m)     Head Circumference --      Peak Flow --      Pain Score 12/09/19 1453 3     Pain Loc --      Pain Edu? --      Excl. in [place]? --    No data found.  Updated Vital Signs BP 126/85 (BP Location: Left Arm)   Pulse 68   Temp 99.1 F (37.3 C) (Oral)   Resp 17   Ht 5\' 6"  (1.676 m)   Wt 56.7 kg   LMP 11/22/2019 (Approximate)   SpO2 100%   BMI  20.18 kg/m   Visual Acuity Right Eye Distance:   Left Eye Distance:   Bilateral Distance:    Right Eye Near:   Left Eye Near:    Bilateral Near:     Physical Exam Vitals and nursing note reviewed.  Constitutional:      General: She is not in acute distress. HENT:     Head: Normocephalic.     Mouth/Throat:     Pharynx: Oropharynx is clear.  Eyes:     Pupils: Pupils are equal, round, and reactive to light.  Cardiovascular:     Rate and Rhythm: Normal rate.     Heart sounds: Normal heart sounds.  Pulmonary:     Breath sounds: Normal breath sounds.  Abdominal:     Palpations: Abdomen is soft.     Tenderness: There is no abdominal tenderness.  Musculoskeletal:     Right lower leg: No edema.     Left lower leg: No edema.     Comments: Right leg has minimal tenderness to palpation popliteal area and posterior calf. Left lower leg has minimal tenderness to palpation above the ankle posteriorly. No erythema, swelling or warmth.  Negative Homan's test.  Lymphadenopathy:     Cervical: No cervical adenopathy.  Skin:    General: Skin is warm and dry.  Neurological:     Mental Status: She is alert.      UC Treatments / Results  Labs (all labs ordered are listed, but only abnormal results are displayed) Labs Reviewed  CK  D-DIMER, QUANTITATIVE (NOT AT [facility])    EKG   Radiology No results found.  Procedures Procedures (including critical care time)  Medications Ordered in UC Medications - No data to display  Initial Impression / Assessment and Plan / UC Course  I have reviewed the triage vital signs and the nursing notes.  Pertinent labs & imaging results that were available during my care of the patient were reviewed by me and considered in my medical decision making (see chart for details).    Doubt DVT, but as a precaution will check D-dimer.  Also  CK. Treat symptomatically for now. Followup with Family Doctor if not improved in one week.    Final  Clinical Impressions(s) / UC Diagnoses   Final diagnoses:  Pain in both lower legs     Discharge Instructions     May take ibuprofen as needed.    ED Prescriptions    None        Kandra Nicolas, MD 12/12/19 1544

## 2020-03-07 ENCOUNTER — Telehealth: Payer: Managed Care, Other (non HMO) | Admitting: Family Medicine

## 2020-03-07 NOTE — Progress Notes (Deleted)
    MyChart Video Visit    Virtual Visit via Video Note   This visit type was conducted due to national recommendations for restrictions regarding the COVID-19 Pandemic (e.g. social distancing) in an effort to limit this patient's exposure and mitigate transmission in our community. This patient is at least at moderate risk for complications without adequate follow up. This format is felt to be most appropriate for this patient at this time. Physical exam was limited by quality of the video and audio technology used for the visit.   Patient location: *** Provider location: ***   Patient: Emily Flowers   DOB: Sep 24, 1995   23 y.o. Female  MRN: [id number] Visit Date: 03/07/2020  Today's healthcare provider: Vernie Murders, PA   No chief complaint on file.  Subjective    Sore Throat     ***  {Show patient history (optional):23778::" "}  Medications: Outpatient Medications Prior to Visit  Medication Sig  . Cetirizine HCl 10 MG CAPS Take by mouth.  . dicyclomine (BENTYL) 10 MG capsule Take 1 capsule (10 mg total) by mouth 4 (four) times daily -  before meals and at bedtime.  . fluticasone (FLONASE) 50 MCG/ACT nasal spray Place into both nostrils daily.  . Multiple Vitamins-Minerals (MULTIVITAMIN WITH MINERALS) tablet Take 1 tablet by mouth daily.   No facility-administered medications prior to visit.    Review of Systems  {Heme  Chem  Endocrine  Serology  Results Review (optional):23779::" "}  Objective    There were no vitals taken for this visit. {Show previous vital signs (optional):23777::" "}  Physical Exam     Assessment & Plan     ***  No follow-ups on file.     I discussed the assessment and treatment plan with the patient. The patient was provided an opportunity to ask questions and all were answered. The patient agreed with the plan and demonstrated an understanding of the instructions.   The patient was advised to call back or seek an  in-person evaluation if the symptoms worsen or if the condition fails to improve as anticipated.  I provided *** minutes of non-face-to-face time during this encounter.  {provider attestation***:1}  Vernie Murders, [place] [phone] (phone) [phone] (fax)  [place]

## 2020-03-30 ENCOUNTER — Other Ambulatory Visit: Payer: Self-pay

## 2020-03-30 ENCOUNTER — Encounter: Payer: Self-pay | Admitting: Unknown Physician Specialty

## 2020-04-04 NOTE — Anesthesia Preprocedure Evaluation (Addendum)
Anesthesia Evaluation  Patient identified by MRN, date of birth, ID band Patient awake    Reviewed: Allergy & Precautions, NPO status , Patient's Chart, lab work & pertinent test results, reviewed documented beta blocker date and time   History of Anesthesia Complications Negative for: history of anesthetic complications  Airway Mallampati: II  TM Distance: >3 FB Neck ROM: Full    Dental   Pulmonary    breath sounds clear to auscultation       Cardiovascular (-) angina(-) DOE  Rhythm:Regular Rate:Normal     Neuro/Psych    GI/Hepatic neg GERD  ,  Endo/Other    Renal/GU      Musculoskeletal   Abdominal   Peds  Hematology   Anesthesia Other Findings   Reproductive/Obstetrics                             Anesthesia Physical Anesthesia Plan  ASA: I  Anesthesia Plan: General   Post-op Pain Management:    Induction: Intravenous  PONV Risk Score and Plan: 4 or greater and Treatment may vary due to age or medical condition, Ondansetron, Dexamethasone, Scopolamine patch - Pre-op and Midazolam  Airway Management Planned: Oral ETT  Additional Equipment:   Intra-op Plan:   Post-operative Plan: Extubation in OR  Informed Consent: I have reviewed the patients History and Physical, chart, labs and discussed the procedure including the risks, benefits and alternatives for the proposed anesthesia with the patient or authorized representative who has indicated his/her understanding and acceptance.       Plan Discussed with: CRNA and Anesthesiologist  Anesthesia Plan Comments:        Anesthesia Quick Evaluation

## 2020-04-06 ENCOUNTER — Other Ambulatory Visit: Payer: Self-pay

## 2020-04-06 ENCOUNTER — Other Ambulatory Visit
Admission: RE | Admit: 2020-04-06 | Discharge: 2020-04-06 | Disposition: A | Discharge status: Home / Self Care | Payer: Managed Care, Other (non HMO) | Source: Ambulatory Visit | Attending: Unknown Physician Specialty | Admitting: Unknown Physician Specialty

## 2020-04-06 DIAGNOSIS — Z01812 Encounter for preprocedural laboratory examination: Secondary | ICD-10-CM | POA: Diagnosis present

## 2020-04-06 DIAGNOSIS — Z20822 Contact with and (suspected) exposure to covid-19: Secondary | ICD-10-CM | POA: Diagnosis not present

## 2020-04-07 LAB — SARS CORONAVIRUS 2 (TAT 6-24 HRS): SARS Coronavirus 2: NEGATIVE

## 2020-04-08 ENCOUNTER — Other Ambulatory Visit: Payer: Self-pay

## 2020-04-08 ENCOUNTER — Ambulatory Visit: Payer: Managed Care, Other (non HMO) | Admitting: Anesthesiology

## 2020-04-08 ENCOUNTER — Other Ambulatory Visit: Payer: Self-pay | Admitting: Unknown Physician Specialty

## 2020-04-08 ENCOUNTER — Encounter: Payer: Self-pay | Admitting: Unknown Physician Specialty

## 2020-04-08 ENCOUNTER — Ambulatory Visit
Admission: RE | Admit: 2020-04-08 | Discharge: 2020-04-08 | Disposition: A | Discharge status: Home / Self Care | Payer: Managed Care, Other (non HMO) | Attending: Unknown Physician Specialty | Admitting: Unknown Physician Specialty

## 2020-04-08 ENCOUNTER — Encounter
Admission: RE | Disposition: A | Discharge status: Home / Self Care | Payer: Self-pay | Source: Home / Self Care | Attending: Unknown Physician Specialty

## 2020-04-08 DIAGNOSIS — J3501 Chronic tonsillitis: Secondary | ICD-10-CM | POA: Insufficient documentation

## 2020-04-08 HISTORY — PX: TONSILLECTOMY: SHX5217

## 2020-04-08 LAB — POCT PREGNANCY, URINE: Preg Test, Ur: NEGATIVE

## 2020-04-08 SURGERY — TONSILLECTOMY
Anesthesia: General | Site: Mouth | Laterality: Bilateral

## 2020-04-08 MED ORDER — BUPIVACAINE HCL (PF) 0.5 % IJ SOLN
INTRAMUSCULAR | Status: DC | PRN
  Administered 2020-04-08: 8 mL

## 2020-04-08 MED ORDER — ACETAMINOPHEN 10 MG/ML IV SOLN
1000.0000 mg | Freq: Once | INTRAVENOUS | Status: AC
  Administered 2020-04-08: 1000 mg via INTRAVENOUS

## 2020-04-08 MED ORDER — SUCCINYLCHOLINE CHLORIDE 20 MG/ML IJ SOLN
INTRAMUSCULAR | Status: DC | PRN
  Administered 2020-04-08: 80 mg via INTRAVENOUS

## 2020-04-08 MED ORDER — GLYCOPYRROLATE 0.2 MG/ML IJ SOLN
INTRAMUSCULAR | Status: DC | PRN
  Administered 2020-04-08: .1 mg via INTRAVENOUS

## 2020-04-08 MED ORDER — SCOPOLAMINE 1 MG/3DAYS TD PT72
1.0000 | MEDICATED_PATCH | TRANSDERMAL | Status: DC
  Administered 2020-04-08: 1.5 mg via TRANSDERMAL

## 2020-04-08 MED ORDER — DEXAMETHASONE SODIUM PHOSPHATE 4 MG/ML IJ SOLN
INTRAMUSCULAR | Status: DC | PRN
  Administered 2020-04-08: 4 mg via INTRAVENOUS

## 2020-04-08 MED ORDER — MEPERIDINE HCL 25 MG/ML IJ SOLN: 6.2500 mg | INTRAMUSCULAR | Status: DC | PRN

## 2020-04-08 MED ORDER — LIDOCAINE HCL (CARDIAC) PF 100 MG/5ML IV SOSY
PREFILLED_SYRINGE | INTRAVENOUS | Status: DC | PRN
  Administered 2020-04-08: 30 mg via INTRAVENOUS

## 2020-04-08 MED ORDER — FENTANYL CITRATE (PF) 100 MCG/2ML IJ SOLN
INTRAMUSCULAR | Status: DC | PRN
  Administered 2020-04-08 (×2): 50 ug via INTRAVENOUS

## 2020-04-08 MED ORDER — LACTATED RINGERS IV SOLN: INTRAVENOUS | Status: DC

## 2020-04-08 MED ORDER — ONDANSETRON HCL 4 MG/2ML IJ SOLN
INTRAMUSCULAR | Status: DC | PRN
  Administered 2020-04-08: 4 mg via INTRAVENOUS

## 2020-04-08 MED ORDER — PROMETHAZINE HCL 25 MG/ML IJ SOLN: 6.2500 mg | INTRAMUSCULAR | Status: DC | PRN

## 2020-04-08 MED ORDER — OXYCODONE HCL 5 MG PO TABS: 5.0000 mg | ORAL_TABLET | Freq: Once | ORAL | Status: AC | PRN

## 2020-04-08 MED ORDER — OXYCODONE HCL 5 MG/5ML PO SOLN
5.0000 mg | Freq: Once | ORAL | Status: AC | PRN
  Administered 2020-04-08: 5 mg via ORAL

## 2020-04-08 MED ORDER — MIDAZOLAM HCL 5 MG/5ML IJ SOLN
INTRAMUSCULAR | Status: DC | PRN
  Administered 2020-04-08: 2 mg via INTRAVENOUS

## 2020-04-08 MED ORDER — HYDROCODONE-ACETAMINOPHEN 7.5-325 MG/15ML PO SOLN: 10.0000 mL | Freq: Four times a day (QID) | ORAL | 0 refills | Status: DC | PRN

## 2020-04-08 MED ORDER — PROPOFOL 10 MG/ML IV BOLUS
INTRAVENOUS | Status: DC | PRN
  Administered 2020-04-08: 200 mg via INTRAVENOUS

## 2020-04-08 MED ORDER — HYDROMORPHONE HCL 1 MG/ML IJ SOLN
0.2500 mg | INTRAMUSCULAR | Status: DC | PRN
  Administered 2020-04-08 (×2): 0.25 mg via INTRAVENOUS

## 2020-04-08 SURGICAL SUPPLY — 21 items
"PENCIL ELECTRO HAND CTR " (MISCELLANEOUS) ×1 IMPLANT
CANISTER SUCT 1200ML W/VALVE (MISCELLANEOUS) ×2 IMPLANT
COAG SUCT 10F 3.5MM HAND CTRL (MISCELLANEOUS) ×2 IMPLANT
DRAPE HEAD BAR (DRAPES) ×2 IMPLANT
ELECT CAUTERY BLADE TIP 2.5 (TIP) ×2
ELECT REM PT RETURN 9FT ADLT (ELECTROSURGICAL) ×2
ELECTRODE CAUTERY BLDE TIP 2.5 (TIP) ×1 IMPLANT
ELECTRODE REM PT RTRN 9FT ADLT (ELECTROSURGICAL) ×1 IMPLANT
GLOVE BIO SURGEON STRL SZ7.5 (GLOVE) ×3 IMPLANT
HANDLE SUCTION POOLE (INSTRUMENTS) ×1 IMPLANT
KIT TURNOVER KIT A (KITS) ×2 IMPLANT
NDL HYPO 25GX1X1/2 BEV (NEEDLE) ×1 IMPLANT
NEEDLE HYPO 25GX1X1/2 BEV (NEEDLE) ×2 IMPLANT
NS IRRIG 500ML POUR BTL (IV SOLUTION) ×2 IMPLANT
PACK TONSIL AND ADENOID CUSTOM (PACKS) ×2 IMPLANT
PENCIL ELECTRO HAND CTR (MISCELLANEOUS) ×2 IMPLANT
SPONGE TONSIL .75 RFD DBL STRL (DISPOSABLE) ×2 IMPLANT
STRAP BODY AND KNEE 60X3 (MISCELLANEOUS) ×2 IMPLANT
SUCTION POOLE HANDLE (INSTRUMENTS) ×2
SYR 10ML LL (SYRINGE) ×1 IMPLANT
SYR 5ML LL (SYRINGE) ×2 IMPLANT

## 2020-04-08 NOTE — Anesthesia Procedure Notes (Signed)
Procedure Name: Intubation Date/Time: 04/08/2020 8:24 AM Performed by: Cameron Ali, CRNA Pre-anesthesia Checklist: Patient identified, Emergency Drugs available, Suction available, Patient being monitored and Timeout performed Patient Re-evaluated:Patient Re-evaluated prior to induction Oxygen Delivery Method: Circle system utilized Preoxygenation: Pre-oxygenation with 100% oxygen Induction Type: IV induction Ventilation: Mask ventilation without difficulty Laryngoscope Size: Mac and 3 Grade View: Grade I Tube type: Oral Rae Tube size: 7.5 mm Number of attempts: 1 Placement Confirmation: ETT inserted through vocal cords under direct vision,  positive ETCO2 and breath sounds checked- equal and bilateral Tube secured with: Tape Dental Injury: Teeth and Oropharynx as per pre-operative assessment

## 2020-04-08 NOTE — Op Note (Signed)
PREOPERATIVE DIAGNOSIS:  chronic tonsillitis  POSTOPERATIVE DIAGNOSIS:  Chronic Tonsillitis  OPERATION:  Tonsillectomy.  SURGEON:  Roena Malady, MD  ANESTHESIA:  General endotracheal.  OPERATIVE FINDINGS:  Large tonsils.  Examination nasopharynx showed no evidence of significant adenoid tissue.  DESCRIPTION OF THE PROCEDURE: Emily Flowers was identified in the holding area and taken to the operating room and placed in the supine position.  After general endotracheal anesthesia, the table was turned 45 degrees and the patient was draped in the usual fashion for a tonsillectomy.  A mouth gag was inserted into the oral cavity.  There were large tonsils.  Beginning on the left-hand side a tenaculum was used to grasp the tonsil and the Bovie cautery was used to dissect it free from the fossa.  In a similar fashion, the right tonsil was removed.  Meticulous hemostasis was achieved using the Bovie cautery.  With both tonsils removed and no active bleeding, 0.5% plain Marcaine was used to inject the anterior and posterior tonsillar pillars bilaterally.  A total of 56ml was used.  The patient tolerated the procedure well and was awakened in the operating room and taken to the recovery room in stable condition.   CULTURES:  None.  SPECIMENS:  Tonsils.  ESTIMATED BLOOD LOSS:  Less than 10 ml.  Roena Malady  04/08/2020  8:38 AM

## 2020-04-08 NOTE — Discharge Instructions (Signed)
T & A INSTRUCTION SHEET - [facility]  Beverly Gust, MD  [address] [place], [place] [address] TEL.  [phone]  INFORMATION SHEET FOR A TONSILLECTOMY AND ADENDOIDECTOMY  About Your Tonsils and Adenoids  The tonsils and adenoids are normal body tissues that are part of our immune system.  They normally help to protect [place] against diseases that may enter our mouth and nose. However, sometimes the tonsils and/or adenoids become too large and obstruct our breathing, especially at night.    If either of these things happen it helps to remove the tonsils and adenoids in order to become healthier. The operation to remove the tonsils and adenoids is called a tonsillectomy and adenoidectomy.  The Location of Your Tonsils and Adenoids  The tonsils are located in the back of the throat on both side and sit in a cradle of muscles. The adenoids are located in the roof of the mouth, behind the nose, and closely associated with the opening of the Eustachian tube to the ear.  Surgery on Tonsils and Adenoids  A tonsillectomy and adenoidectomy is a short operation which takes about thirty minutes.  This includes being put to sleep and being awakened. Tonsillectomies and adenoidectomies are performed at [facility] and may require observation period in the recovery room prior to going home. Children are required to remain in recovery for at least 45 minutes.   Following the Operation for a Tonsillectomy  A cautery machine is used to control bleeding. Bleeding from a tonsillectomy and adenoidectomy is minimal and postoperatively the risk of bleeding is approximately four percent, although this rarely life threatening.  After your tonsillectomy and adenoidectomy post-op care at home: 1. Our patients are able to go home the same day. You may be given prescriptions for pain medications, if indicated. 2. It is extremely important to  remember that fluid intake is of utmost importance after a tonsillectomy. The amount that you drink must be maintained in the postoperative period. A good indication of whether a child is getting enough fluid is whether his/her urine output is constant. As long as children are urinating or wetting their diaper every 6 - 8 hours this is usually enough fluid intake.   3. Although rare, this is a risk of some bleeding in the first ten days after surgery. This usually occurs between day five and nine postoperatively. This risk of bleeding is approximately four percent. If you or your child should have any bleeding you should remain calm and notify our office or go directly to the emergency room at [facility] where they will contact us. Our doctors are available seven days a week for notification. We recommend sitting up quietly in a chair, place an ice pack on the front of the neck and spitting out the blood gently until we are able to contact you. Adults should gargle gently with ice water and this may help stop the bleeding. If the bleeding does not stop after a short time, i.e. 10 to 15 minutes, or seems to be increasing again, please contact us or go to the hospital.   4. It is common for the pain to be worse at 5 - 7 days postoperatively. This occurs because the "scab" is peeling off and the mucous membrane (skin of the throat) is growing back where the tonsils were.   5. It is common for a low-grade fever, less than 102, during the first week  after a tonsillectomy and adenoidectomy. It is usually due to not drinking enough liquids, and we suggest your use liquid Tylenol (acetaminophen) or the pain medicine with Tylenol (acetaminophen) prescribed in order to keep your temperature below 102. Please follow the directions on the back of the bottle. 6. Recommendations for post-operative pain in children and adults: a) For Children 12 and younger: Recommendations are for oral Tylenol  (acetaminophen) and oral Motrin (Ibuprofen). Administer the Tylenol (acetaminophen) and Motrin (ibuprofen) as stated on bottle for patient's age/weight. Sometimes it may be necessary to alternate the Tylenol (acetaminophen) and Motrin for improved pain control. Motrin does last slightly longer so many patients benefit from being given this prior to bedtime. All children should avoid Aspirin products for 2 weeks following surgery. b) For children over the age of 12: Tylenol (acetaminophen) is the preferred first choice for pain control. Depending on your child's size, sometimes they will be given a combination of Tylenol (acetaminophen) and hydrocodone medication or sometimes it will be recommended they take Motrin (ibuprofen) in addition to the Tylenol (acetaminophen). Narcotics should always be used with caution in children following surgery as they can suppress their breathing and switching to over the counter Tylenol (acetaminophen) and Motrin (ibuprofen) as soon as possible is recommended. All patients should avoid Aspirin products for 2 weeks following surgery. c) Adults: Usually adults will require a narcotic pain medication following a tonsillectomy. This usually has either hydrocodone or oxycodone in it and can usually be taken every 4 to 6 hours as needed for moderate pain. If the medication does not have Tylenol (acetaminophen) in it, you may also supplement Tylenol (acetaminophen) as needed every 4 to 6 hours for breakthrough or mild pain. Adults should avoid Aspirin, Aleve, Motrin, and Ibuprofen products for 2 weeks following surgery as they can increase your risk of bleeding. 7. If you happen to look in the mirror or into your child's mouth you will see white/gray patches on the back of the throat. This is what a scab looks like in the mouth and is normal after having a tonsillectomy and adenoidectomy. They will disappear once the tonsil areas heal completely. However, it may cause a noticeable odor,  and this too will disappear with time.     8. You or your child may experience ear pain after having a tonsillectomy and adenoidectomy.  This is called referred pain and comes from the throat, but it is felt in the ears.  Ear pain is quite common and expected. It will usually go away after ten days. There is usually nothing wrong with the ears, and it is primarily due to the healing area stimulating the nerve to the ear that runs along the side of the throat. Use either the prescribed pain medicine or Tylenol (acetaminophen) as needed.  9. The throat tissues after a tonsillectomy are obviously sensitive. Smoking around children who have had a tonsillectomy significantly increases the risk of bleeding. DO NOT SMOKE!  General Anesthesia, Adult, Care After This sheet gives you information about how to care for yourself after your procedure. Your health care provider may also give you more specific instructions. If you have problems or questions, contact your health care provider. What can I expect after the procedure? After the procedure, the following side effects are common:  Pain or discomfort at the IV site.  Nausea.  Vomiting.  Sore throat.  Trouble concentrating.  Feeling cold or chills.  Weak or tired.  Sleepiness and fatigue.  Soreness and body aches.   These side effects can affect parts of the body that were not involved in surgery. Follow these instructions at home:  For at least 24 hours after the procedure:  Have a responsible adult stay with you. It is important to have someone help care for you until you are awake and alert.  Rest as needed.  Do not: ? Participate in activities in which you could fall or become injured. ? Drive. ? Use heavy machinery. ? Drink alcohol. ? Take sleeping pills or medicines that cause drowsiness. ? Make important decisions or sign legal documents. ? Take care of children on your own. Eating and drinking  Follow any instructions from  your health care provider about eating or drinking restrictions.  When you feel hungry, start by eating small amounts of foods that are soft and easy to digest (bland), such as toast. Gradually return to your regular diet.  Drink enough fluid to keep your urine pale yellow.  If you vomit, rehydrate by drinking water, juice, or clear broth. General instructions  If you have sleep apnea, surgery and certain medicines can increase your risk for breathing problems. Follow instructions from your health care provider about wearing your sleep device: ? Anytime you are sleeping, including during daytime naps. ? While taking prescription pain medicines, sleeping medicines, or medicines that make you drowsy.  Return to your normal activities as told by your health care provider. Ask your health care provider what activities are safe for you.  Take over-the-counter and prescription medicines only as told by your health care provider.  If you smoke, do not smoke without supervision.  Keep all follow-up visits as told by your health care provider. This is important. Contact a health care provider if:  You have nausea or vomiting that does not get better with medicine.  You cannot eat or drink without vomiting.  You have pain that does not get better with medicine.  You are unable to pass urine.  You develop a skin rash.  You have a fever.  You have redness around your IV site that gets worse. Get help right away if:  You have difficulty breathing.  You have chest pain.  You have blood in your urine or stool, or you vomit blood. Summary  After the procedure, it is common to have a sore throat or nausea. It is also common to feel tired.  Have a responsible adult stay with you for the first 24 hours after general anesthesia. It is important to have someone help care for you until you are awake and alert.  When you feel hungry, start by eating small amounts of foods that are soft and  easy to digest (bland), such as toast. Gradually return to your regular diet.  Drink enough fluid to keep your urine pale yellow.  Return to your normal activities as told by your health care provider. Ask your health care provider what activities are safe for you. This information is not intended to replace advice given to you by your health care provider. Make sure you discuss any questions you have with your health care provider. Document Revised: 08/16/2017 Document Reviewed: 03/29/2017 Elsevier Patient Education  2020 Elsevier Inc.  Scopolamine skin patches Remove in 72 hrs. Wash hands immediately after removal. What is this medicine? SCOPOLAMINE (skoe POL a meen) is used to prevent nausea and vomiting caused by motion sickness, anesthesia and surgery. This medicine may be used for other purposes; ask your health care provider or pharmacist if you have   questions. COMMON BRAND NAME(S): Transderm Scop What should I tell my health care provider before I take this medicine? They need to know if you have any of these conditions:  are scheduled to have a gastric secretion test  glaucoma  heart disease  kidney disease  liver disease  lung or breathing disease, like asthma  mental illness  prostate disease  seizures  stomach or intestine problems  trouble passing urine  an unusual or allergic reaction to scopolamine, atropine, other medicines, foods, dyes, or preservatives  pregnant or trying to get pregnant  breast-feeding How should I use this medicine? This medicine is for external use only. Follow the directions on the prescription label. Wear only 1 patch at a time. Choose an area behind the ear, that is clean, dry, hairless and free from any cuts or irritation. Wipe the area with a clean dry tissue. Peel off the plastic backing of the skin patch, trying not to touch the adhesive side with your hands. Do not cut the patches. Firmly apply to the area you have chosen,  with the metallic side of the patch to the skin and the tan-colored side showing. Once firmly in place, wash your hands well with soap and water. Do not get this medicine into your eyes. After removing the patch, wash your hands and the area behind your ear thoroughly with soap and water. The patch will still contain some medicine after use. To avoid accidental contact or ingestion by children or pets, fold the used patch in half with the sticky side together and throw away in the trash out of the reach of children and pets. If you need to use a second patch after you remove the first, place it behind the other ear. A special MedGuide will be given to you by the pharmacist with each prescription and refill. Be sure to read this information carefully each time. Talk to your pediatrician regarding the use of this medicine in children. Special care may be needed. Overdosage: If you think you have taken too much of this medicine contact a poison control center or emergency room at once. NOTE: This medicine is only for you. Do not share this medicine with others. What if I miss a dose? This does not apply. This medicine is not for regular use. What may interact with this medicine?  alcohol  antihistamines for allergy cough and cold  atropine  certain medicines for anxiety or sleep  certain medicines for bladder problems like oxybutynin, tolterodine  certain medicines for depression like amitriptyline, fluoxetine, sertraline  certain medicines for stomach problems like dicyclomine, hyoscyamine  certain medicines for Parkinson's disease like benztropine, trihexyphenidyl  certain medicines for seizures like phenobarbital, primidone  general anesthetics like halothane, isoflurane, methoxyflurane, propofol  ipratropium  local anesthetics like lidocaine, pramoxine, tetracaine  medicines that relax muscles for surgery  phenothiazines like chlorpromazine, mesoridazine, prochlorperazine,  thioridazine  narcotic medicines for pain  other belladonna alkaloids This list may not describe all possible interactions. Give your health care provider a list of all the medicines, herbs, non-prescription drugs, or dietary supplements you use. Also tell them if you smoke, drink alcohol, or use illegal drugs. Some items may interact with your medicine. What should I watch for while using this medicine? Limit contact with water while swimming and bathing because the patch may fall off. If the patch falls off, throw it away and put a new one behind the other ear. You may get drowsy or dizzy. Do not drive, use   machinery, or do anything that needs mental alertness until you know how this medicine affects you. Do not stand or sit up quickly, especially if you are an older patient. This reduces the risk of dizzy or fainting spells. Alcohol may interfere with the effect of this medicine. Avoid alcoholic drinks. Your mouth may get dry. Chewing sugarless gum or sucking hard candy, and drinking plenty of water may help. Contact your healthcare professional if the problem does not go away or is severe. This medicine may cause dry eyes and blurred vision. If you wear contact lenses, you may feel some discomfort. Lubricating drops may help. See your healthcare professional if the problem does not go away or is severe. If you are going to need surgery, an MRI, CT scan, or other procedure, tell your healthcare professional that you are using this medicine. You may need to remove the patch before the procedure. What side effects may I notice from receiving this medicine? Side effects that you should report to your doctor or health care professional as soon as possible:  allergic reactions like skin rash, itching or hives; swelling of the face, lips, or tongue  blurred vision  changes in vision  confusion  dizziness  eye pain  fast, irregular heartbeat  hallucinations, loss of contact with  reality  nausea, vomiting  pain or trouble passing urine  restlessness  seizures  skin irritation  stomach pain Side effects that usually do not require medical attention (report to your doctor or health care professional if they continue or are bothersome):  drowsiness  dry mouth  headache  sore throat This list may not describe all possible side effects. Call your doctor for medical advice about side effects. You may report side effects to FDA at 1-800-FDA-1088. Where should I keep my medicine? Keep out of the reach of children. Store at room temperature between 20 and 25 degrees C (68 and 77 degrees F). Keep this medicine in the foil package until ready to use. Throw away any unused medicine after the expiration date. NOTE: This sheet is a summary. It may not cover all possible information. If you have questions about this medicine, talk to your doctor, pharmacist, or health care provider.  2020 Elsevier/Gold Standard (2017-11-01 16:14:46)   

## 2020-04-08 NOTE — H&P (Signed)
The patient's history has been reviewed, patient examined, no change in status, stable for surgery.  Questions were answered to the patients satisfaction.  

## 2020-04-08 NOTE — Transfer of Care (Signed)
Immediate Anesthesia Transfer of Care Note  Patient: Emily Flowers  Procedure(s) Performed: TONSILLECTOMY (Bilateral Mouth)  Patient Location: PACU  Anesthesia Type: General  Level of Consciousness: awake, alert  and patient cooperative  Airway and Oxygen Therapy: Patient Spontanous Breathing and Patient connected to supplemental oxygen  Post-op Assessment: Post-op Vital signs reviewed, Patient's Cardiovascular Status Stable, Respiratory Function Stable, Patent Airway and No signs of Nausea or vomiting  Post-op Vital Signs: Reviewed and stable  Complications: No complications documented.

## 2020-04-08 NOTE — Anesthesia Postprocedure Evaluation (Signed)
Anesthesia Post Note  Patient: Emily Flowers  Procedure(s) Performed: TONSILLECTOMY (Bilateral Mouth)     Patient location during evaluation: PACU Anesthesia Type: General Level of consciousness: awake and alert Pain management: pain level controlled Vital Signs Assessment: post-procedure vital signs reviewed and stable Respiratory status: spontaneous breathing, nonlabored ventilation, respiratory function stable and patient connected to nasal cannula oxygen Cardiovascular status: blood pressure returned to baseline and stable Postop Assessment: no apparent nausea or vomiting Anesthetic complications: no   No complications documented.  Frederik Standley A  Shontavia Mickel

## 2020-04-11 ENCOUNTER — Encounter: Payer: Self-pay | Admitting: Unknown Physician Specialty

## 2020-04-12 LAB — SURGICAL PATHOLOGY

## 2020-06-17 ENCOUNTER — Other Ambulatory Visit: Payer: Self-pay

## 2020-06-17 ENCOUNTER — Encounter: Payer: Self-pay | Admitting: Certified Nurse Midwife

## 2020-06-17 ENCOUNTER — Ambulatory Visit (INDEPENDENT_AMBULATORY_CARE_PROVIDER_SITE_OTHER): Payer: Self-pay | Admitting: Certified Nurse Midwife

## 2020-06-17 VITALS — BP 107/65 | HR 65 | Resp 16 | Ht 66.0 in | Wt 125.0 lb

## 2020-06-17 DIAGNOSIS — L739 Follicular disorder, unspecified: Secondary | ICD-10-CM

## 2020-06-17 DIAGNOSIS — N63 Unspecified lump in unspecified breast: Secondary | ICD-10-CM

## 2020-06-17 DIAGNOSIS — Z01419 Encounter for gynecological examination (general) (routine) without abnormal findings: Secondary | ICD-10-CM

## 2020-06-17 NOTE — Progress Notes (Signed)
Gynecology Annual Exam   History of Present Illness: Emily Flowers is a 24 y.o. single female presenting for an annual exam. She is moving to [place] this weekend for a job and to be near her fiance. She is getting married in the Spring, and does not plan to use contraception after becoming sexually active. She has complaints of a lump in her right breast. She noticed it 2 mos ago. More prominent with her periods, tender at times. She is not sexually active. She does perform self breast exams. There is no notable family history of breast or ovarian cancer in her family. Also c/o bump in her left groin/vaginal area. Comes with her periods and goes away. No redness or discharge. Has used alcohol on it.  Past Medical History:  Past Medical History:  Diagnosis Date  . Acne   . COVID-19 05/28/2019  . Heart murmur     Past Surgical History:  Past Surgical History:  Procedure Laterality Date  . TONSILLECTOMY Bilateral 04/08/2020   Procedure: TONSILLECTOMY;  Surgeon: Beverly Gust, MD;  Location: [facility];  Service: ENT;  Laterality: Bilateral;  . WISDOM TOOTH EXTRACTION      Gynecologic History:  LMP: Patient's last menstrual period was 06/09/2020. Average Interval: regular Heavy Menses: no Clots: no Intermenstrual Bleeding: no Postcoital Bleeding: not applicable Dysmenorrhea: no Contraception: abstinence Last Pap: completed on 03/16/19 ; result was: no abnormalities  Mammogram: n/a  Obstetric History: [id number]  Family History:  Family History  Problem Relation Age of Onset  . Diabetes Father   . Cancer Maternal Aunt        breast  . Diabetes Paternal Aunt   . Diabetes Paternal Uncle     Social History:  Social History   Socioeconomic History  . Marital status: Single    Spouse name: Not on file  . Number of children: Not on file  . Years of education: Not on file  . Highest education level: Not on file  Occupational History  . Not on file  Tobacco  Use  . Smoking status: Never Smoker  . Smokeless tobacco: Never Used  Vaping Use  . Vaping Use: Never used  Substance and Sexual Activity  . Alcohol use: No    Alcohol/week: 0.0 standard drinks  . Drug use: No  . Sexual activity: Not Currently    Partners: Male    Birth control/protection: Condom    Comment: orsynthia  Other Topics Concern  . Not on file  Social History Narrative  . Not on file   Social Determinants of Health   Financial Resource Strain:   . Difficulty of Paying Living Expenses: Not on file  Food Insecurity:   . Worried About Charity fundraiser in the Last Year: Not on file  . Ran Out of Food in the Last Year: Not on file  Transportation Needs:   . Lack of Transportation (Medical): Not on file  . Lack of Transportation (Non-Medical): Not on file  Physical Activity:   . Days of Exercise per Week: Not on file  . Minutes of Exercise per Session: Not on file  Stress:   . Feeling of Stress : Not on file  Social Connections:   . Frequency of Communication with Friends and Family: Not on file  . Frequency of Social Gatherings with Friends and Family: Not on file  . Attends Religious Services: Not on file  . Active Member of Clubs or Organizations: Not on file  . Attends Club or  Organization Meetings: Not on file  . Marital Status: Not on file  Intimate Partner Violence:   . Fear of Current or Ex-Partner: Not on file  . Emotionally Abused: Not on file  . Physically Abused: Not on file  . Sexually Abused: Not on file   Allergies:  Allergies  Allergen Reactions  . Banana Swelling    Throat swells  . Penicillins Hives  . Sulfa Antibiotics     Unknown childhood reaction   Medications: Prior to Admission medications   Medication Sig Start Date End Date Taking? Authorizing Provider  levocetirizine (XYZAL) 5 MG tablet Take 5 mg by mouth every evening.   Yes [provider]  Multiple Vitamin (MULTIVITAMIN) tablet Take 1 tablet by mouth daily.    Yes [provider]  fluticasone (FLONASE) 50 MCG/ACT nasal spray Place into both nostrils daily.    [provider]    Review of Systems: negative except noted in HPI  Physical Exam Vitals: BP 107/65   Pulse 65   Resp 16   Ht 5\' 6"  (1.676 m)   Wt 125 lb (56.7 kg)   LMP 06/09/2020   BMI 20.18 kg/m  General: NAD HEENT: normocephalic, atraumatic Thyroid: no enlargement, no palpable nodules Pulmonary: Normal rate and effort, CTAB Cardiovascular: RRR Breast: Breast symmetrical; Left: no tenderness, no palpable nodules or masses, no skin or nipple retraction present, no nipple discharge, no axillary or supraclavicular lymphadenopathy; Right: no tenderness, no skin or nipple retraction present, no nipple discharge, no axillary or supraclavicular lymphadenopathy, 1cm firm mobile mass RUQ at 10 o'clock Abdomen: soft, non-tender, non-distended. No hepatomegaly, splenomegaly or masses palpable. No evidence of hernia  Genitourinary:  External: Normal external female genitalia. Normal urethral meatus; 1/2cm firm nodule in left groin/vulvar region (below hair follicles), no edema or erythema  Internal: deferred Extremities: no edema, erythema, or tenderness Neurologic: Grossly intact Psychiatric: mood appropriate, affect full  Female chaperone present for pelvic and breast portions of the physical exam  Assessment:  1. Breast mass   2. Well woman exam   3. Folliculitis    Plan: Warm compresses bid-tid to affected area Breast US today (pt moving out of state in 2 days) Follow up with GYN in 1 year or prn- establish care ASAP in new city Follow up with PCP yearly or prn Start PNV 2 mos before unprotected sexual activity  Julianne Handler, [place] 06/17/2020 10:17 AM

## 2020-06-17 NOTE — Patient Instructions (Signed)

## 2021-03-15 DIAGNOSIS — J4520 Mild intermittent asthma, uncomplicated: Secondary | ICD-10-CM | POA: Insufficient documentation

## 2021-03-15 DIAGNOSIS — K589 Irritable bowel syndrome without diarrhea: Secondary | ICD-10-CM | POA: Insufficient documentation

## 2022-06-29 ENCOUNTER — Telehealth: Payer: Self-pay | Admitting: Family Medicine

## 2022-06-29 ENCOUNTER — Ambulatory Visit: Payer: Self-pay

## 2022-06-29 DIAGNOSIS — J069 Acute upper respiratory infection, unspecified: Secondary | ICD-10-CM

## 2022-06-29 NOTE — Progress Notes (Signed)
Virtual Visit Consent   Emily Flowers, you are scheduled for a virtual visit with a [facility] provider today. Just as with appointments in the office, your consent must be obtained to participate. Your consent will be active for this visit and any virtual visit you may have with one of our providers in the next 365 days. If you have a MyChart account, a copy of this consent can be sent to you electronically.  As this is a virtual visit, video technology does not allow for your provider to perform a traditional examination. This may limit your provider's ability to fully assess your condition. If your provider identifies any concerns that need to be evaluated in person or the need to arrange testing (such as labs, EKG, etc.), we will make arrangements to do so. Although advances in technology are sophisticated, we cannot ensure that it will always work on either your end or our end. If the connection with a video visit is poor, the visit may have to be switched to a telephone visit. With either a video or telephone visit, we are not always able to ensure that we have a secure connection.  By engaging in this virtual visit, you consent to the provision of healthcare and authorize for your insurance to be billed (if applicable) for the services provided during this visit. Depending on your insurance coverage, you may receive a charge related to this service.  I need to obtain your verbal consent now. Are you willing to proceed with your visit today? Emily Flowers has provided verbal consent on 06/29/2022 for a virtual visit (video or telephone). Emily Nims, FNP  Date: 06/29/2022 3:34 PM  Virtual Visit via Video Note   I, Emily Flowers, connected with  Emily Flowers  ([id number], 1995-12-01) on 06/29/22 at  3:30 PM EDT by a video-enabled telemedicine application and verified that I am speaking with the correct person using two identifiers.  Location: Patient: Virtual Visit Location  Patient: Home Provider: Virtual Visit Location Provider: Home Office   I discussed the limitations of evaluation and management by telemedicine and the availability of in person appointments. The patient expressed understanding and agreed to proceed.    History of Present Illness: Emily Flowers is a 26 y.o. who identifies as a female who was assigned female at birth, and is being seen today for sore throat, headache, chills, body aches that started yesterday but have improved today. She requests a work note. He is continuing to take tylenol and mucinex. 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  Resting comfortably  at home.  Head is normocephalic, atraumatic.  No labored breathing.  Speech is clear and coherent with logical content.  Patient is alert and  oriented at baseline.    Assessment and Plan: 1. Viral URI  Increase fluids, warm salt water gargles, tylenol, mucinex, call back or go to urgent care if sx worsen or persist.   Follow Up Instructions: I discussed the assessment and treatment plan with the patient. The patient was provided an opportunity to ask questions and all were answered. The patient agreed with the plan and demonstrated an understanding  of the instructions.  A copy of instructions were sent to the patient via MyChart unless otherwise noted below.   Patient has requested to receive PHI (AVS, Work Notes, etc) pertaining to this video visit through e-mail as they are currently without active [facility]. They have voiced understand that email is not considered secure and their health information could be viewed by someone other than the patient.   The patient was advised to call back or seek an in-person evaluation if the symptoms worsen or if the condition fails to improve as anticipated.  Time:  I spent 10 minutes with the patient via telehealth technology discussing the above problems/concerns.    Emily Nims, FNP

## 2022-06-29 NOTE — Patient Instructions (Signed)

## 2022-07-04 ENCOUNTER — Telehealth: Payer: 59 | Admitting: Physician Assistant

## 2022-07-04 DIAGNOSIS — B9689 Other specified bacterial agents as the cause of diseases classified elsewhere: Secondary | ICD-10-CM

## 2022-07-04 DIAGNOSIS — J069 Acute upper respiratory infection, unspecified: Secondary | ICD-10-CM

## 2022-07-04 MED ORDER — AMOXICILLIN 875 MG PO TABS: 875.0000 mg | ORAL_TABLET | Freq: Two times a day (BID) | ORAL | 0 refills | Status: AC

## 2022-07-04 NOTE — Patient Instructions (Signed)
Emily Flowers, thank you for joining Emily LovelessJennifer M Zebulan Hinshaw, PA-C for today's virtual visit.  While this provider is not your primary care provider (PCP), if your PCP is located in our provider database this encounter information will be shared with them immediately following your visit.   A [facility] MyChart account gives you access to today's visit and all your visits, tests, and labs performed at [facility] " click here if you don't have a [facility] MyChart account or go to [email or web address]  Consent: (Patient) Emily Byeshelsey Crumrine provided verbal consent for this virtual visit at the beginning of the encounter.  Current Medications:  Current Outpatient Medications:    amoxicillin (AMOXIL) 875 MG tablet, Take 1 tablet (875 mg total) by mouth 2 (two) times daily for 10 days., Disp: 20 tablet, Rfl: 0   Medications ordered in this encounter:  Meds ordered this encounter  Medications   amoxicillin (AMOXIL) 875 MG tablet    Sig: Take 1 tablet (875 mg total) by mouth 2 (two) times daily for 10 days.    Dispense:  20 tablet    Refill:  0    Order Specific Question:   Supervising Provider    Answer:   Merrilee JanskyLAMPTEY, PHILIP O [id number]]     *If you need refills on other medications prior to your next appointment, please contact your pharmacy*  Follow-Up: Call back or seek an in-person evaluation if the symptoms worsen or if the condition fails to improve as anticipated.  [facility] [phone]  Other Instructions Sinus Infection, Adult A sinus infection, also called sinusitis, is inflammation of your sinuses. Sinuses are hollow spaces in the bones around your face. Your sinuses are located: Around your eyes. In the middle of your forehead. Behind your nose. In your cheekbones. Mucus normally drains out of your sinuses. When your nasal tissues become inflamed or swollen, mucus can become trapped or blocked. This allows bacteria, viruses, and fungi to grow, which  leads to infection. Most infections of the sinuses are caused by a virus. A sinus infection can develop quickly. It can last for up to 4 weeks (acute) or for more than 12 weeks (chronic). A sinus infection often develops after a cold. What are the causes? This condition is caused by anything that creates swelling in the sinuses or stops mucus from draining. This includes: Allergies. Asthma. Infection from bacteria or viruses. Deformities or blockages in your nose or sinuses. Abnormal growths in the nose (nasal polyps). Pollutants, such as chemicals or irritants in the air. Infection from fungi. This is rare. What increases the risk? You are more likely to develop this condition if you: Have a weak body defense system (immune system). Do a lot of swimming or diving. Overuse nasal sprays. Smoke. What are the signs or symptoms? The main symptoms of this condition are pain and a feeling of pressure around the affected sinuses. Other symptoms include: Stuffy nose or congestion that makes it difficult to breathe through your nose. Thick yellow or greenish drainage from your nose. Tenderness, swelling, and warmth over the affected sinuses. A cough that may get worse at night. Decreased sense of smell and taste. Extra mucus that collects in the throat or the back of the nose (postnasal drip) causing a sore throat or bad breath. Tiredness (fatigue). Fever. How is this diagnosed? This condition is diagnosed based on: Your symptoms. Your medical history. A physical exam. Tests to find out if your condition is acute or chronic. This may include:  Checking your nose for nasal polyps. Viewing your sinuses using a device that has a light (endoscope). Testing for allergies or bacteria. Imaging tests, such as an MRI or CT scan. In rare cases, a bone biopsy may be done to rule out more serious types of fungal sinus disease. How is this treated? Treatment for a sinus infection depends on the cause  and whether your condition is chronic or acute. If caused by a virus, your symptoms should go away on their own within 10 days. You may be given medicines to relieve symptoms. They include: Medicines that shrink swollen nasal passages (decongestants). A spray that eases inflammation of the nostrils (topical intranasal corticosteroids). Rinses that help get rid of thick mucus in your nose (nasal saline washes). Medicines that treat allergies (antihistamines). Over-the-counter pain relievers. If caused by bacteria, your health care provider may recommend waiting to see if your symptoms improve. Most bacterial infections will get better without antibiotic medicine. You may be given antibiotics if you have: A severe infection. A weak immune system. If caused by narrow nasal passages or nasal polyps, surgery may be needed. Follow these instructions at home: Medicines Take, use, or apply over-the-counter and prescription medicines only as told by your health care provider. These may include nasal sprays. If you were prescribed an antibiotic medicine, take it as told by your health care provider. Do not stop taking the antibiotic even if you start to feel better. Hydrate and humidify  Drink enough fluid to keep your urine pale yellow. Staying hydrated will help to thin your mucus. Use a cool mist humidifier to keep the humidity level in your home above 50%. Inhale steam for 10-15 minutes, 3-4 times a day, or as told by your health care provider. You can do this in the bathroom while a hot shower is running. Limit your exposure to cool or dry air. Rest Rest as much as possible. Sleep with your head raised (elevated). Make sure you get enough sleep each night. General instructions  Apply a warm, moist washcloth to your face 3-4 times a day or as told by your health care provider. This will help with discomfort. Use nasal saline washes as often as told by your health care provider. Wash your hands  often with soap and water to reduce your exposure to germs. If soap and water are not available, use hand sanitizer. Do not smoke. Avoid being around people who are smoking (secondhand smoke). Keep all follow-up visits. This is important. Contact a health care provider if: You have a fever. Your symptoms get worse. Your symptoms do not improve within 10 days. Get help right away if: You have a severe headache. You have persistent vomiting. You have severe pain or swelling around your face or eyes. You have vision problems. You develop confusion. Your neck is stiff. You have trouble breathing. These symptoms may be an emergency. Get help right away. Call 911. Do not wait to see if the symptoms will go away. Do not drive yourself to the hospital. Summary A sinus infection is soreness and inflammation of your sinuses. Sinuses are hollow spaces in the bones around your face. This condition is caused by nasal tissues that become inflamed or swollen. The swelling traps or blocks the flow of mucus. This allows bacteria, viruses, and fungi to grow, which leads to infection. If you were prescribed an antibiotic medicine, take it as told by your health care provider. Do not stop taking the antibiotic even if you start to  feel better. Keep all follow-up visits. This is important. This information is not intended to replace advice given to you by your health care provider. Make sure you discuss any questions you have with your health care provider. Document Revised: 07/18/2021 Document Reviewed: 07/18/2021 Elsevier Patient Education  2023 Elsevier Inc.    If you have been instructed to have an in-person evaluation today at a local Urgent Care facility, please use the link below. It will take you to a list of all of our available [facility], including address, phone number and hours of operation. Please do not delay care.  [facility]  If you or a family member do not  have a primary care provider, use the link below to schedule a visit and establish care. When you choose a [facility] primary care physician or advanced practice provider, you gain a long-term partner in health. Find a Primary Care Provider  Learn more about [facility] in-office and virtual care options: [facility] - Get Care Now

## 2022-07-04 NOTE — Progress Notes (Signed)
Virtual Visit Consent   Emily Flowers, you are scheduled for a virtual visit with a [facility] provider today. Just as with appointments in the office, your consent must be obtained to participate. Your consent will be active for this visit and any virtual visit you may have with one of our providers in the next 365 days. If you have a MyChart account, a copy of this consent can be sent to you electronically.  As this is a virtual visit, video technology does not allow for your provider to perform a traditional examination. This may limit your provider's ability to fully assess your condition. If your provider identifies any concerns that need to be evaluated in person or the need to arrange testing (such as labs, EKG, etc.), we will make arrangements to do so. Although advances in technology are sophisticated, we cannot ensure that it will always work on either your end or our end. If the connection with a video visit is poor, the visit may have to be switched to a telephone visit. With either a video or telephone visit, we are not always able to ensure that we have a secure connection.  By engaging in this virtual visit, you consent to the provision of healthcare and authorize for your insurance to be billed (if applicable) for the services provided during this visit. Depending on your insurance coverage, you may receive a charge related to this service.  I need to obtain your verbal consent now. Are you willing to proceed with your visit today? [name] has provided verbal consent on 07/04/2022 for a virtual visit (video or telephone). [name], PA-C  Date: 07/04/2022 12:40 PM  Virtual Visit via Video Note   IMargaretann Loveless, connected with  Emily Flowers  ([id number], [birth year]) on 07/04/22 at 12:30 PM EST by a video-enabled telemedicine application and verified that I am speaking with the correct person using two identifiers.  Location: Patient: Virtual Visit Location  Patient: Mobile Provider: Virtual Visit Location Provider: Home Office   I discussed the limitations of evaluation and management by telemedicine and the availability of in person appointments. The patient expressed understanding and agreed to proceed.    History of Present Illness: Emily Flowers is a 26 y.o. who identifies as a female who was assigned female at birth, and is being seen today for continued URI symptoms.  HPI: URI  This is a new problem. The current episode started in the past 7 days (Seen virtually on 06/28/22 and diagnosed with Viral URI). The problem has been gradually worsening. There has been no fever. Associated symptoms include chest pain (tightness), congestion, coughing, headaches, rhinorrhea, sinus pain and a sore throat. Pertinent negatives include no nausea. Associated symptoms comments: Hoarse voice.  She is breastfeeding    Problems: There are no problems to display for this patient.   Allergies:  Allergies  Allergen Reactions   Sulfa Antibiotics    Medications:  Current Outpatient Medications:    amoxicillin (AMOXIL) 875 MG tablet, Take 1 tablet (875 mg total) by mouth 2 (two) times daily for 10 days., Disp: 20 tablet, Rfl: 0  Observations/Objective: Patient is well-developed, well-nourished in no acute distress.  Resting comfortably Head is normocephalic, atraumatic.  No labored breathing.  Speech is clear and coherent with logical content.  Patient is alert and oriented at baseline.    Assessment and Plan: 1. Bacterial upper respiratory infection - amoxicillin (AMOXIL) 875 MG tablet; Take 1 tablet (875 mg total) by mouth 2 (two)  times daily for 10 days.  Dispense: 20 tablet; Refill: 0  - Worsening symptoms that have not responded to OTC medications.  - Will give Amoxicillin - Continue allergy medications.  - Steam and humidifier can help - Stay well hydrated and get plenty of rest.  - Seek in person evaluation if no symptom improvement or if  symptoms worsen   Follow Up Instructions: I discussed the assessment and treatment plan with the patient. The patient was provided an opportunity to ask questions and all were answered. The patient agreed with the plan and demonstrated an understanding of the instructions.  A copy of instructions were sent to the patient via MyChart unless otherwise noted below.    The patient was advised to call back or seek an in-person evaluation if the symptoms worsen or if the condition fails to improve as anticipated.  Time:  I spent 10 minutes with the patient via telehealth technology discussing the above problems/concerns.    [name], PA-C

## 2022-08-02 ENCOUNTER — Ambulatory Visit: Payer: Self-pay | Admitting: Obstetrics & Gynecology

## 2022-08-17 DIAGNOSIS — L218 Other seborrheic dermatitis: Secondary | ICD-10-CM | POA: Diagnosis not present

## 2022-08-27 NOTE — L&D Delivery Note (Signed)
Delivery Note: Emily Flowers [id number] at [redacted]w[redacted]d  Admitting diagnosis: Normal labor and delivery [O80]   First Stage: Induction of labor: No, augmented for < 2 hours with pitocin. Onset of labor: 07/06/2023 at 240 AM Analgesia /Anesthesia/Pain control intrapartum: None  Second Stage: Consented for waterbirth and entered the WB tub. She was in the tub for about 2 hours this AM (5:30-7:30) and then got out to walk and needed pitocin to help contractions become closer.  Complete dilation at 07/06/2023 12:03 Birth parent pushing self directed. Supported by FOBChristiane Ha FHR monitored intermittently and was appropriate throughout pushing.  Pushing in variable positions with waterbirth provider and LD support staff present for birth and supportive. Nuchal Cord: No  Delivery of a Live born female  Birth Weight:   APGAR: 8, 9  Newborn Delivery   Birth date/time: 07/06/2023 12:47:00 Delivery type: Vaginal, Spontaneous    APGAR: 10 min-    Infant delivered in cephalic presentation, in OA position. Mother was transitioned from all fours to reclining for delivery of shoulders. Infant was grasped by birth parent with support of waterbirth provider Federico Flake). Offered mother to reach out and grasp infant. [name] reached for infant and broutght them above the water. Placed on birth parent chest.  Awaited cord to stop pulsation. Double clamped by Federico Flake, MD MD, cut by  Loretha Stapler of cord blood for typing completed. Cord blood donation-None Arterial cord blood sample-No   Third Stage: Patient stood out of the water and with gentle traction placenta delivered into awaiting specimen tub. Placenta delivered Spontaneous with 3 vessels. Uterine tone firm, bleeding Small Uterotonics: None- Patient declined  Placenta to L&D   None laceration identified.  Episiotomy:None Local analgesia: None  Repair:  NA Est. Blood Loss (mL):58.00   Complications: None   Mom to postpartum.  Baby Emily Flowers (ZT) to Couplet care / Skin to Skin.  Delivery Report:  Review the Delivery Report for details.     Signed: Federico Flake, MD  07/06/2023, 1:18 PM

## 2022-09-18 ENCOUNTER — Other Ambulatory Visit (HOSPITAL_COMMUNITY)
Admission: RE | Admit: 2022-09-18 | Discharge: 2022-09-18 | Disposition: A | Discharge status: Home / Self Care | Payer: 59 | Source: Ambulatory Visit | Attending: Obstetrics & Gynecology | Admitting: Obstetrics & Gynecology

## 2022-09-18 ENCOUNTER — Encounter: Payer: Self-pay | Admitting: Obstetrics & Gynecology

## 2022-09-18 ENCOUNTER — Ambulatory Visit (INDEPENDENT_AMBULATORY_CARE_PROVIDER_SITE_OTHER): Payer: 59 | Admitting: Obstetrics & Gynecology

## 2022-09-18 VITALS — BP 109/72 | HR 62 | Ht 66.0 in | Wt 121.0 lb

## 2022-09-18 DIAGNOSIS — Z01419 Encounter for gynecological examination (general) (routine) without abnormal findings: Secondary | ICD-10-CM | POA: Insufficient documentation

## 2022-09-18 DIAGNOSIS — Z113 Encounter for screening for infections with a predominantly sexual mode of transmission: Secondary | ICD-10-CM

## 2022-09-18 DIAGNOSIS — N946 Dysmenorrhea, unspecified: Secondary | ICD-10-CM

## 2022-09-18 DIAGNOSIS — D219 Benign neoplasm of connective and other soft tissue, unspecified: Secondary | ICD-10-CM | POA: Diagnosis not present

## 2022-09-18 DIAGNOSIS — R69 Illness, unspecified: Secondary | ICD-10-CM | POA: Diagnosis not present

## 2022-09-18 DIAGNOSIS — N3941 Urge incontinence: Secondary | ICD-10-CM

## 2022-09-18 LAB — POCT URINALYSIS DIPSTICK
Bilirubin, UA: NEGATIVE
Glucose, UA: NEGATIVE
Ketones, UA: NEGATIVE
Leukocytes, UA: NEGATIVE
Nitrite, UA: NEGATIVE
Odor: NEGATIVE
Protein, UA: NEGATIVE
Spec Grav, UA: 1.01 (ref 1.010–1.025)
Urobilinogen, UA: 0.2 E.U./dL
pH, UA: 7 (ref 5.0–8.0)

## 2022-09-18 NOTE — Progress Notes (Signed)
GYNECOLOGY ANNUAL PREVENTATIVE CARE ENCOUNTER NOTE  History:     Emily Flowers is a 27 y.o. P41 female here for a routine annual gynecologic exam.  Current complaints: increased urge and incomplete bladder emptying for several months. Also has vaginal and pelvic pain during her menstrual periods.  Wants follow up ultrasound for her history of fibroids or adenomyosis.  Also desires yearly STI screen.   Denies abnormal vaginal bleeding, discharge, problems with intercourse or other gynecologic concerns.    Gynecologic History Patient's last menstrual period was 09/09/2022. Contraception: condoms Last Pap: 03/16/2019. Result was normal with negative HPV  Obstetric History OB History  No obstetric history on file.    Past Medical History:  Diagnosis Date   Acne    COVID-19 05/28/2019   Heart murmur     Past Surgical History:  Procedure Laterality Date   TONSILLECTOMY Bilateral 04/08/2020   Procedure: TONSILLECTOMY;  Surgeon: Beverly Gust, MD;  Location: [place];  Service: ENT;  Laterality: Bilateral;   WISDOM TOOTH EXTRACTION      Current Outpatient Medications on File Prior to Visit  Medication Sig Dispense Refill   cetirizine (ZYRTEC) 10 MG chewable tablet Chew 10 mg by mouth daily.     Multiple Vitamin (MULTIVITAMIN) tablet Take 1 tablet by mouth daily.     Ferrous Sulfate (IRON PO) Take 1 tablet by mouth daily. (Patient not taking: Reported on 09/18/2022)     fluticasone (FLONASE) 50 MCG/ACT nasal spray Place into both nostrils daily. (Patient not taking: Reported on 09/18/2022)     levocetirizine (XYZAL) 5 MG tablet Take 5 mg by mouth every evening. (Patient not taking: Reported on 09/18/2022)     No current facility-administered medications on file prior to visit.    Allergies  Allergen Reactions   Banana Swelling    Throat swells   Penicillins Hives   Sulfa Antibiotics     Unknown childhood reaction    Social History:  reports that she has never  smoked. She has never been exposed to tobacco smoke. She has never used smokeless tobacco. She reports that she does not drink alcohol and does not use drugs.  Family History  Problem Relation Age of Onset   Diabetes Father    Cancer Maternal Aunt        breast   Diabetes Paternal Aunt    Diabetes Paternal Uncle     The following portions of the patient's history were reviewed and updated as appropriate: allergies, current medications, past family history, past medical history, past social history, past surgical history and problem list.  Review of Systems Pertinent items noted in HPI and remainder of comprehensive ROS otherwise negative.  Physical Exam:  BP 109/72   Pulse 62   Ht '5\' 6"'$  (1.676 m)   Wt 121 lb (54.9 kg)   LMP 09/09/2022   BMI 19.53 kg/m  CONSTITUTIONAL: Well-developed, well-nourished female in no acute distress.  HENT:  Normocephalic, atraumatic, External right and left ear normal.  EYES: Conjunctivae and EOM are normal. Pupils are equal, round, and reactive to light. No scleral icterus.  NECK: Normal range of motion, supple, no masses.  Normal thyroid.  SKIN: Skin is warm and dry. No rash noted. Not diaphoretic. No erythema. No pallor. MUSCULOSKELETAL: Normal range of motion. No tenderness.  No cyanosis, clubbing, or edema. NEUROLOGIC: Alert and oriented to person, place, and time. Normal reflexes, muscle tone coordination.  PSYCHIATRIC: Normal mood and affect. Normal behavior. Normal judgment and thought content.  CARDIOVASCULAR: Normal heart rate noted, regular rhythm RESPIRATORY: Clear to auscultation bilaterally. Effort and breath sounds normal, no problems with respiration noted. BREASTS: Symmetric in size. No masses, tenderness, skin changes, nipple drainage, or lymphadenopathy bilaterally. Performed in the presence of a chaperone. ABDOMEN: Soft, no distention noted.  No tenderness, rebound or guarding.  PELVIC: Normal appearing external genitalia and urethral  meatus; normal appearing vaginal mucosa and cervix.  No abnormal vaginal discharge noted.  Pap smear obtained.  Normal uterine size, no other palpable masses, no uterine or adnexal tenderness.  Performed in the presence of a chaperone.  Results for orders placed or performed in visit on 09/18/22 (from the past 24 hour(s))  POCT Urinalysis Dipstick     Status: None   Collection Time: 09/18/22 10:40 AM  Result Value Ref Range   Color, UA yellow    Clarity, UA clear    Glucose, UA Negative Negative   Bilirubin, UA negative    Ketones, UA negative    Spec Grav, UA 1.010 1.010 - 1.025   Blood, UA trace    pH, UA 7.0 5.0 - 8.0   Protein, UA Negative Negative   Urobilinogen, UA 0.2 0.2 or 1.0 E.U./dL   Nitrite, UA negative    Leukocytes, UA Negative Negative   Appearance     Odor negative       Assessment and Plan:     1. Fibroids 2. Dysmenorrhea NSAIDs recommended for pain as needed. Will follow up ultrasound and manage accordingly. - US PELVIC COMPLETE WITH TRANSVAGINAL; Future  3. Urge incontinence - POCT Urinalysis Dipstick negative - Urine Culture sent - Ambulatory referral to Urology done, will follow up recommendations  4. Routine screening for STI (sexually transmitted infection) STI screen done, will follow up results and manage accordingly. - Cytology - PAP - RPR+HBsAg+HCVAb+...  5. Well woman exam with routine gynecological exam - Cytology - PAP Will follow up results of pap smear and manage accordingly. Normal breast examination. Routine preventative health maintenance measures emphasized. Please refer to After Visit Summary for other counseling recommendations.      Verita Schneiders, MD, [place] for [employer], [place]

## 2022-09-19 LAB — RPR+HBSAG+HCVAB+...
HIV Screen 4th Generation wRfx: NONREACTIVE
Hep C Virus Ab: NONREACTIVE
Hepatitis B Surface Ag: NEGATIVE
RPR Ser Ql: NONREACTIVE

## 2022-09-20 LAB — URINE CULTURE: Organism ID, Bacteria: NO GROWTH

## 2022-09-21 LAB — CYTOLOGY - PAP
Chlamydia: NEGATIVE
Comment: NEGATIVE
Comment: NEGATIVE
Comment: NEGATIVE
Comment: NORMAL
Diagnosis: NEGATIVE
High risk HPV: NEGATIVE
Neisseria Gonorrhea: NEGATIVE
Trichomonas: NEGATIVE

## 2022-09-23 ENCOUNTER — Encounter: Payer: Self-pay | Admitting: Obstetrics & Gynecology

## 2022-09-24 ENCOUNTER — Ambulatory Visit
Admission: RE | Admit: 2022-09-24 | Discharge: 2022-09-24 | Disposition: A | Discharge status: Home / Self Care | Payer: 59 | Source: Ambulatory Visit | Attending: Obstetrics & Gynecology | Admitting: Obstetrics & Gynecology

## 2022-09-24 DIAGNOSIS — N946 Dysmenorrhea, unspecified: Secondary | ICD-10-CM

## 2022-09-24 DIAGNOSIS — D259 Leiomyoma of uterus, unspecified: Secondary | ICD-10-CM | POA: Diagnosis not present

## 2022-09-25 ENCOUNTER — Encounter: Payer: Self-pay | Admitting: Obstetrics & Gynecology

## 2022-09-27 ENCOUNTER — Ambulatory Visit (INDEPENDENT_AMBULATORY_CARE_PROVIDER_SITE_OTHER): Payer: 59 | Admitting: Obstetrics and Gynecology

## 2022-09-27 ENCOUNTER — Encounter: Payer: Self-pay | Admitting: Obstetrics and Gynecology

## 2022-09-27 VITALS — BP 110/76 | HR 59 | Wt 120.0 lb

## 2022-09-27 DIAGNOSIS — R5383 Other fatigue: Secondary | ICD-10-CM | POA: Diagnosis not present

## 2022-09-27 DIAGNOSIS — E559 Vitamin D deficiency, unspecified: Secondary | ICD-10-CM | POA: Diagnosis not present

## 2022-09-27 DIAGNOSIS — D72819 Decreased white blood cell count, unspecified: Secondary | ICD-10-CM | POA: Diagnosis not present

## 2022-09-27 DIAGNOSIS — Z131 Encounter for screening for diabetes mellitus: Secondary | ICD-10-CM | POA: Diagnosis not present

## 2022-09-27 DIAGNOSIS — L659 Nonscarring hair loss, unspecified: Secondary | ICD-10-CM | POA: Diagnosis not present

## 2022-09-27 NOTE — Progress Notes (Signed)
Obstetrics and Gynecology Visit Return Patient Evaluation  Appointment Date: 09/27/2022  OBGYN Clinic: [facility]  Chief Complaint: follow up labs and ultrasound  History of Present Illness:  Emily Flowers is a 27 y.o. with above CC. Patient seen by Dr. Harolyn Flowers on 1/23 for annual exam. Pap and STI screening wnl. She was endorsing some mixed UI and had a negative Ucx and was referred to Urology. She was also endorsing a history of adenomyosis and fibroids, diagnosed in [place], and had a repeat u/s ordered for h/o pain with her periods.  LMP 1/5 and she had her ultrasound on 1/29 which was negative except for a ?1cm posterior, IM fibroid seen in the mid fundal area. No e/o adenomyosis was seen. August 2020 care everywhere u/s did not show any e/o adenomyosis or a fibroid.   She was in communication with Dr. Loni Flowers re: the ultrasound results, who felt that her ultrasound was negative and not contributing to any s/s of hair loss or a potential hormonal imbalance.   Patient is here for sooner, in person discussion re: her testing results.   Review of Systems: as noted in the History of Present Illness.  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 Current Outpatient Medications  Medication Sig Dispense Refill   Multiple Vitamin (MULTIVITAMIN) tablet Take 1 tablet by mouth daily.     cetirizine (ZYRTEC) 10 MG chewable tablet Chew 10 mg by mouth daily.     Ferrous Sulfate (IRON PO) Take 1 tablet by mouth daily. (Patient not taking: Reported on 09/18/2022)     fluticasone (FLONASE) 50 MCG/ACT nasal spray Place into both nostrils daily. (Patient not taking: Reported on 09/18/2022)     levocetirizine (XYZAL) 5 MG tablet Take 5 mg by mouth every evening. (Patient not taking: Reported on 09/18/2022)     No current  facility-administered medications for this visit.    Allergies: is allergic to banana, penicillins, and sulfa antibiotics.  Physical Exam:  BP 110/76   Pulse (!) 59   Wt 120 lb (54.4 kg)   LMP 09/09/2022   BMI 19.37 kg/m  Body mass index is 19.37 kg/m. General appearance: Well nourished, well developed female in no acute distress.  Neuro/Psych:  Normal mood and affect.    Labs: as per HPI  Radiology: Narrative & Impression  CLINICAL DATA:  Pelvic pain.   EXAM: TRANSABDOMINAL AND TRANSVAGINAL ULTRASOUND OF PELVIS   TECHNIQUE: Both transabdominal and transvaginal ultrasound examinations of the pelvis were performed. Transabdominal technique was performed for global imaging of the pelvis including uterus, ovaries, adnexal regions, and pelvic cul-de-sac. It was necessary to proceed with endovaginal exam following the transabdominal exam to visualize the adnexal structures.   COMPARISON:  None Available.   FINDINGS: Uterus   Measurements: 7.6 x 4.8 x 5.0 cm = volume: 95.5 mL. There is a 1.1 x 1.6 x 0.8 cm intramural hypoechoic mass within the posterior right uterine fundus, likely a small fibroid.   Endometrium   Thickness: 13.9 mm.  No focal abnormality visualized.   Right ovary   Measurements: 3.2 x 1.6 x 1.8 cm = volume: 4.7 mL. Normal appearance/no adnexal mass.   Left ovary   Measurements: 3.3 x 1.4 x 2.4 cm = volume: 5.7 mL. Normal appearance/no adnexal mass.   Other findings   No abnormal free fluid.  IMPRESSION: 1. No acute process within the pelvis. 2. Small uterine fibroid.     Electronically Signed   By: Emily Flowers M.D.   On: 09/24/2022 13:12   Assessment: patient stable  Plan:  Results I told her that I agree with Dr. Mickey Flowers assessment that I feel, after looking at the ultrasound images, that her ultrasound is negative and that if there is a fibroid there it is not in a location or a size to cause any issues or problems. I also told her  that, clinically, her I don't feel she needs hormone level testing as she is having qmonth regular periods. They are not heavy or prolonged but can be painful. I told her that I recommend trying OTC NSAIDs or can try some herbal remedies for painful periods to help, as well. In regards to hair loss, she is breast feeding 2-3x/day and I told her that this can occur with breastfeeding. I recommend she contact her PCP to make sure nothing else is missed; she desires lab testing today so basic labwork done today.  - TSH Rfx on Abnormal to Free T4 - VITAMIN D 25 Hydroxy (Vit-D Deficiency, Fractures) - CBC - Comprehensive metabolic panel - Hemoglobin A1c   RTC: PRN  Durene Romans MD Attending Center for [facility])

## 2022-09-27 NOTE — Progress Notes (Signed)
[name] patient here to discuss Fibroids. Wants second opinion.   Also wants to discuss Labs.  CC: Pain with periods, hair loss, losing a lot of weight.irregular spotting a day before U/S.   LMP: 09/09/22 Last pap:09/18/22

## 2022-09-28 ENCOUNTER — Encounter: Payer: Self-pay | Admitting: Obstetrics and Gynecology

## 2022-09-28 DIAGNOSIS — E0590 Thyrotoxicosis, unspecified without thyrotoxic crisis or storm: Secondary | ICD-10-CM

## 2022-09-28 DIAGNOSIS — D72819 Decreased white blood cell count, unspecified: Secondary | ICD-10-CM

## 2022-09-28 HISTORY — DX: Decreased white blood cell count, unspecified: D72.819

## 2022-09-28 HISTORY — DX: Thyrotoxicosis, unspecified without thyrotoxic crisis or storm: E05.90

## 2022-09-28 LAB — COMPREHENSIVE METABOLIC PANEL
ALT: 15 IU/L (ref 0–32)
AST: 20 IU/L (ref 0–40)
Albumin/Globulin Ratio: 1.7 (ref 1.2–2.2)
Albumin: 4.5 g/dL (ref 4.0–5.0)
Alkaline Phosphatase: 52 IU/L (ref 44–121)
BUN/Creatinine Ratio: 14 (ref 9–23)
BUN: 11 mg/dL (ref 6–20)
Bilirubin Total: 0.4 mg/dL (ref 0.0–1.2)
CO2: 21 mmol/L (ref 20–29)
Calcium: 9.1 mg/dL (ref 8.7–10.2)
Chloride: 104 mmol/L (ref 96–106)
Creatinine, Ser: 0.78 mg/dL (ref 0.57–1.00)
Globulin, Total: 2.7 g/dL (ref 1.5–4.5)
Glucose: 74 mg/dL (ref 70–99)
Potassium: 4.1 mmol/L (ref 3.5–5.2)
Sodium: 140 mmol/L (ref 134–144)
Total Protein: 7.2 g/dL (ref 6.0–8.5)
eGFR: 106 mL/min/{1.73_m2} (ref >59)

## 2022-09-28 LAB — CBC
Hematocrit: 37.6 % (ref 34.0–46.6)
Hemoglobin: 12.9 g/dL (ref 11.1–15.9)
MCH: 31.4 pg (ref 26.6–33.0)
MCHC: 34.3 g/dL (ref 31.5–35.7)
MCV: 92 fL (ref 79–97)
Platelets: 185 10*3/uL (ref 150–450)
RBC: 4.11 x10E6/uL (ref 3.77–5.28)
RDW: 12.9 % (ref 11.7–15.4)
WBC: 2.1 10*3/uL — CL (ref 3.4–10.8)

## 2022-09-28 LAB — HEMOGLOBIN A1C
Est. average glucose Bld gHb Est-mCnc: 103 mg/dL
Hgb A1c MFr Bld: 5.2 % (ref 4.8–5.6)

## 2022-09-28 LAB — VITAMIN D 25 HYDROXY (VIT D DEFICIENCY, FRACTURES): Vit D, 25-Hydroxy: 33.8 ng/mL (ref 30.0–100.0)

## 2022-09-28 LAB — TSH RFX ON ABNORMAL TO FREE T4: TSH: 0.446 u[IU]/mL — ABNORMAL LOW (ref 0.450–4.500)

## 2022-09-28 LAB — T4F: T4,Free (Direct): 1.25 ng/dL (ref 0.82–1.77)

## 2022-09-28 NOTE — Addendum Note (Signed)
Addended by: Aletha Halim on: 09/28/2022 08:29 AM   Modules accepted: Orders

## 2022-10-01 NOTE — Progress Notes (Unsigned)
New patient visit   Patient: Emily Flowers   DOB: 1995/12/29   27 y.o. Female  MRN: [id number] Visit Date: 10/02/2022  Today's healthcare provider: Mardene Speak, PA-C   No chief complaint on file.  Subjective    Emily Flowers is a 27 y.o. female who presents today as a new patient to establish care.  HPI .   CC: Pain with periods, hair loss, losing a lot of weight.irregular spotting a day before U/S.   Past Medical History:  Diagnosis Date   Acne    COVID-19 05/28/2019   Heart murmur    Past Surgical History:  Procedure Laterality Date   TONSILLECTOMY Bilateral 04/08/2020   Procedure: TONSILLECTOMY;  Surgeon: Beverly Gust, MD;  Location: [place];  Service: ENT;  Laterality: Bilateral;   WISDOM TOOTH EXTRACTION     Family Status  Relation Name Status   Father  Alive   Mother  Alive   Mat Aunt  (Not Specified)   Ethlyn Daniels  (Not Specified)   Annamarie Major  (Not Specified)   Family History  Problem Relation Age of Onset   Diabetes Father    Cancer Maternal Aunt        breast   Diabetes Paternal Aunt    Diabetes Paternal Uncle    Social History   Socioeconomic History   Marital status: Married    Spouse name: Not on file   Number of children: Not on file   Years of education: Not on file   Highest education level: Not on file  Occupational History   Not on file  Tobacco Use   Smoking status: Never    Passive exposure: Never   Smokeless tobacco: Never  Vaping Use   Vaping Use: Never used  Substance and Sexual Activity   Alcohol use: No    Alcohol/week: 0.0 standard drinks of alcohol   Drug use: No   Sexual activity: Yes    Partners: Male    Birth control/protection: Condom    Comment: [email or web address]  Other Topics Concern   Not on file  Social History Narrative   ** Merged History Encounter **       Social Determinants of Health   Financial Resource Strain: Not on file  Food Insecurity: Not on file  Transportation Needs: Not on  file  Physical Activity: Not on file  Stress: Not on file  Social Connections: Not on file   Outpatient Medications Prior to Visit  Medication Sig   cetirizine (ZYRTEC) 10 MG chewable tablet Chew 10 mg by mouth daily.   Ferrous Sulfate (IRON PO) Take 1 tablet by mouth daily. (Patient not taking: Reported on 09/18/2022)   fluticasone (FLONASE) 50 MCG/ACT nasal spray Place into both nostrils daily. (Patient not taking: Reported on 09/18/2022)   levocetirizine (XYZAL) 5 MG tablet Take 5 mg by mouth every evening. (Patient not taking: Reported on 09/18/2022)   Multiple Vitamin (MULTIVITAMIN) tablet Take 1 tablet by mouth daily.   No facility-administered medications prior to visit.   Allergies  Allergen Reactions   Banana Swelling    Throat swells   Penicillins Hives   Sulfa Antibiotics     Unknown childhood reaction    Immunization History  Administered Date(s) Administered   DTaP 09/03/1994, 02/15/1995, 04/26/1995, 11/26/1995, 07/12/1998   HIB (PRP-OMP) 02/15/1995, 04/26/1995, 11/26/1995   Hepatitis A 11/17/2008, 12/20/2009   Hepatitis B June 08, 1994, 09/03/1994, 02/15/1995   Hpv-Unspecified 12/20/2009, 04/14/2010, 12/26/2010   IPV 09/03/1994, 02/15/1995, 04/26/1995, 07/12/1998  Influenza, Seasonal, Injecte, Preservative Fre 06/02/2018, 06/16/2019   Influenza,inj,Quad PF,6+ Mos 08/26/2013   Influenza-Unspecified 06/04/2018   MMR 11/26/1995, 07/12/1998   Meningococcal Conjugate 11/17/2008, 12/26/2010   PFIZER(Purple Top)SARS-COV-2 Vaccination 11/04/2019, 11/25/2019, 06/08/2020   Tdap 11/17/2008, 02/18/2019   Varicella 11/26/1995, 11/17/2008    Health Maintenance  Topic Date Due   INFLUENZA VACCINE  03/27/2022   COVID-19 Vaccine (4 - 2023-24 season) 04/27/2022   PAP-Cervical Cytology Screening  09/18/2025   PAP SMEAR-Modifier  09/18/2025   DTaP/Tdap/Td (8 - Td or Tdap) 02/17/2029   HPV VACCINES  Completed   Hepatitis C Screening  Completed   HIV Screening  Completed     Patient Care Team: Chrismon, Vickki Muff, PA-C (Inactive) as PCP - General (Family Medicine)  Review of Systems  {Labs  Heme  Chem  Endocrine  Serology  Results Review (optional):23779}   Objective    LMP 09/09/2022  {Show previous vital signs (optional):23777}  Physical Exam ***  Depression Screen    09/18/2022   10:28 AM 10/22/2019    8:04 AM 06/19/2018   11:21 AM 06/18/2017   11:40 AM  PHQ 2/9 Scores  PHQ - 2 Score 2 0 0 0  PHQ- 9 Score 4      No results found for any visits on 10/02/22.  Assessment & Plan     ***  The patient was advised to call back or seek an in-person evaluation if the symptoms worsen or if the condition fails to improve as anticipated.  I discussed the assessment and treatment plan with the patient. The patient was provided an opportunity to ask questions and all were answered. The patient agreed with the plan and demonstrated an understanding of the instructions.  The entirety of the information documented in the History of Present Illness, Review of Systems and Physical Exam were personally obtained by me. Portions of this information were initially documented by the CMA and reviewed by me for thoroughness and accuracy.  Mardene Speak, [facility], [place] [phone] (phone) [phone] (fax)

## 2022-10-02 ENCOUNTER — Ambulatory Visit (INDEPENDENT_AMBULATORY_CARE_PROVIDER_SITE_OTHER): Payer: Medicaid Other | Admitting: Physician Assistant

## 2022-10-02 ENCOUNTER — Encounter: Payer: Self-pay | Admitting: Physician Assistant

## 2022-10-02 ENCOUNTER — Telehealth: Payer: Self-pay | Admitting: *Deleted

## 2022-10-02 VITALS — BP 101/87 | HR 74 | Temp 97.9°F | Ht 66.0 in | Wt 121.0 lb

## 2022-10-02 DIAGNOSIS — N3941 Urge incontinence: Secondary | ICD-10-CM

## 2022-10-02 DIAGNOSIS — R7989 Other specified abnormal findings of blood chemistry: Secondary | ICD-10-CM

## 2022-10-02 DIAGNOSIS — J301 Allergic rhinitis due to pollen: Secondary | ICD-10-CM | POA: Diagnosis not present

## 2022-10-02 DIAGNOSIS — J382 Nodules of vocal cords: Secondary | ICD-10-CM | POA: Diagnosis not present

## 2022-10-02 DIAGNOSIS — D729 Disorder of white blood cells, unspecified: Secondary | ICD-10-CM | POA: Diagnosis not present

## 2022-10-02 NOTE — Telephone Encounter (Signed)
Called the pt and introduced myself. I told her I wanted to make sure that pt knew about the appt. And why and if she had questions. She states that she did not know that her GYN made the referral until she saw it on a my chart. She knows it was because of her WBC has been a little low couple of times. I gave her directions to get here and she has visited [facility] before. She knows the date and time of appt, she was given the free valet, and 1 person to come with her over the age of 107. No children or babies . And must wear a mask and they are available at the cancer center. Pt. Was thankful for the call

## 2022-10-03 ENCOUNTER — Encounter: Payer: Self-pay | Admitting: Oncology

## 2022-10-03 ENCOUNTER — Inpatient Hospital Stay: Payer: Medicaid Other

## 2022-10-03 ENCOUNTER — Inpatient Hospital Stay: Payer: Medicaid Other | Attending: Oncology | Admitting: Oncology

## 2022-10-03 VITALS — BP 98/65 | HR 68 | Temp 96.8°F | Ht 66.0 in | Wt 120.0 lb

## 2022-10-03 DIAGNOSIS — Z8616 Personal history of COVID-19: Secondary | ICD-10-CM | POA: Insufficient documentation

## 2022-10-03 DIAGNOSIS — D259 Leiomyoma of uterus, unspecified: Secondary | ICD-10-CM | POA: Insufficient documentation

## 2022-10-03 DIAGNOSIS — Z79899 Other long term (current) drug therapy: Secondary | ICD-10-CM | POA: Diagnosis not present

## 2022-10-03 DIAGNOSIS — D709 Neutropenia, unspecified: Secondary | ICD-10-CM | POA: Diagnosis not present

## 2022-10-03 DIAGNOSIS — E0590 Thyrotoxicosis, unspecified without thyrotoxic crisis or storm: Secondary | ICD-10-CM | POA: Diagnosis not present

## 2022-10-03 DIAGNOSIS — D72819 Decreased white blood cell count, unspecified: Secondary | ICD-10-CM | POA: Diagnosis not present

## 2022-10-03 DIAGNOSIS — R590 Localized enlarged lymph nodes: Secondary | ICD-10-CM | POA: Insufficient documentation

## 2022-10-03 DIAGNOSIS — D708 Other neutropenia: Secondary | ICD-10-CM | POA: Diagnosis not present

## 2022-10-03 DIAGNOSIS — R011 Cardiac murmur, unspecified: Secondary | ICD-10-CM | POA: Diagnosis not present

## 2022-10-03 LAB — CBC WITH DIFFERENTIAL/PLATELET
Abs Immature Granulocytes: 0.01 10*3/uL (ref 0.00–0.07)
Basophils Absolute: 0 10*3/uL (ref 0.0–0.1)
Basophils Relative: 0 %
Eosinophils Absolute: 0.1 10*3/uL (ref 0.0–0.5)
Eosinophils Relative: 2 %
HCT: 37.4 % (ref 36.0–46.0)
Hemoglobin: 12.3 g/dL (ref 12.0–15.0)
Immature Granulocytes: 0 %
Lymphocytes Relative: 38 %
Lymphs Abs: 1.9 10*3/uL (ref 0.7–4.0)
MCH: 30.8 pg (ref 26.0–34.0)
MCHC: 32.9 g/dL (ref 30.0–36.0)
MCV: 93.7 fL (ref 80.0–100.0)
Monocytes Absolute: 0.3 10*3/uL (ref 0.1–1.0)
Monocytes Relative: 6 %
Neutro Abs: 2.7 10*3/uL (ref 1.7–7.7)
Neutrophils Relative %: 54 %
Platelets: 222 10*3/uL (ref 150–400)
RBC: 3.99 MIL/uL (ref 3.87–5.11)
RDW: 13 % (ref 11.5–15.5)
WBC: 5.1 10*3/uL (ref 4.0–10.5)
nRBC: 0 % (ref 0.0–0.2)

## 2022-10-03 LAB — VITAMIN B12: Vitamin B-12: 503 pg/mL (ref 180–914)

## 2022-10-03 LAB — TECHNOLOGIST SMEAR REVIEW
Plt Morphology: NORMAL
RBC MORPHOLOGY: NORMAL
WBC MORPHOLOGY: NORMAL

## 2022-10-03 LAB — FOLATE: Folate: 34 ng/mL (ref >5.9)

## 2022-10-03 NOTE — Progress Notes (Signed)
Hematology/Oncology Consult note [facility] [phone] [phone]  Patient Care Team: Chrismon, Driscilla Grammes (Inactive) as PCP - General (Family Medicine)   Name of the patient: Emily Flowers  [id number]  September 05, 1995    Reason for referral-leukopenia/neutropenia   Referring physician-Dennis Chrismon PA  Date of visit: 10/03/22   History of presenting illness-patient is a 27 year old African-American female who has been referred for leukopenia/neutropenia white cell count has been mainly fluctuating between 3.5-4.5.  In 2021 her white count did go to 2.9 and then bounced back to 3.5.  Most recent CBC from 09/27/2022 showed a white cell count of 2.1 with an H&H of 12.9/37.6 and a platelet count of 185.  Patient denies any over-the-counter herbal medications.  She was recently checked for HIV hep B and hep C as well which was all negative.  Her weight has been fluctuating and at times she feels that she is unable to gain weight.  She is still breast-feeding her 79-year-old.  She denies any recurrent infection  ECOG PS- 0  Pain scale- 0   Review of systems- Review of Systems  Constitutional:  Negative for chills, fever, malaise/fatigue and weight loss.  HENT:  Negative for congestion, ear discharge and nosebleeds.   Eyes:  Negative for blurred vision.  Respiratory:  Negative for cough, hemoptysis, sputum production, shortness of breath and wheezing.   Cardiovascular:  Negative for chest pain, palpitations, orthopnea and claudication.  Gastrointestinal:  Negative for abdominal pain, blood in stool, constipation, diarrhea, heartburn, melena, nausea and vomiting.  Genitourinary:  Negative for dysuria, flank pain, frequency, hematuria and urgency.  Musculoskeletal:  Negative for back pain, joint pain and myalgias.  Skin:  Negative for rash.  Neurological:  Negative for dizziness, tingling, focal weakness, seizures, weakness and headaches.   Endo/Heme/Allergies:  Does not bruise/bleed easily.  Psychiatric/Behavioral:  Negative for depression and suicidal ideas. The patient does not have insomnia.     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     Past Medical History:  Diagnosis Date   Acne    COVID-19 05/28/2019   Heart murmur      Past Surgical History:  Procedure Laterality Date   TONSILLECTOMY Bilateral 04/08/2020   Procedure: TONSILLECTOMY;  Surgeon: Beverly Gust, MD;  Location: [place];  Service: ENT;  Laterality: Bilateral;   WISDOM TOOTH EXTRACTION      Social History   Socioeconomic History   Marital status: Married    Spouse name: Not on file   Number of children: Not on file   Years of education: Not on file   Highest education level: Not on file  Occupational History   Not on file  Tobacco Use   Smoking status: Never    Passive exposure: Never   Smokeless tobacco: Never  Vaping Use   Vaping Use: Never used  Substance and Sexual Activity   Alcohol use: No    Alcohol/week: 0.0 standard drinks of alcohol   Drug use: No   Sexual activity: Yes    Partners: Male    Birth control/protection: Condom    Comment: orsynthia  Other Topics Concern   Not on file  Social History Narrative   ** Merged History Encounter **  Social Determinants of Health   Financial Resource Strain: Not on file  Food Insecurity: No Food Insecurity (10/03/2022)   Hunger Vital Sign    Worried About Running Out of Food in the Last Year: Never true    Ran Out of Food in the Last Year: Never true  Transportation Needs: No Transportation Needs (10/03/2022)   PRAPARE - Hydrologist (Medical): No    Lack of Transportation (Non-Medical): No  Physical  Activity: Not on file  Stress: Not on file  Social Connections: Not on file  Intimate Partner Violence: Not At Risk (10/03/2022)   Humiliation, Afraid, Rape, and Kick questionnaire    Fear of Current or Ex-Partner: No    Emotionally Abused: No    Physically Abused: No    Sexually Abused: No     Family History  Problem Relation Age of Onset   Diabetes Father    Cancer Maternal Aunt        breast   Diabetes Paternal Aunt    Diabetes Paternal Uncle      Current Outpatient Medications:    acetaminophen (TYLENOL) 500 MG tablet, Take by mouth., Disp: , Rfl:    betamethasone dipropionate 0.05 % lotion, SMARTSIG:Sparingly Topical Twice Daily, Disp: , Rfl:    cetirizine (ZYRTEC) 10 MG chewable tablet, Chew 10 mg by mouth daily., Disp: , Rfl:    fluticasone (FLONASE) 50 MCG/ACT nasal spray, Place into both nostrils daily., Disp: , Rfl:    Cetirizine HCl (ZYRTEC ALLERGY) 10 MG CAPS, Take by mouth. (Patient not taking: Reported on 10/03/2022), Disp: , Rfl:    Physical exam:  Vitals:   10/03/22 1058  BP: 98/65  Pulse: 68  Temp: (!) 96.8 F (36 C)  TempSrc: Tympanic  SpO2: 100%  Weight: 120 lb (54.4 kg)  Height: '5\' 6"'$  (1.676 m)   Physical Exam Cardiovascular:     Rate and Rhythm: Normal rate and regular rhythm.     Heart sounds: Normal heart sounds.  Pulmonary:     Effort: Pulmonary effort is normal.     Breath sounds: Normal breath sounds.  Abdominal:     General: Bowel sounds are normal.     Palpations: Abdomen is soft.     Comments: No palpable splenomegaly  Lymphadenopathy:     Comments: There is no palpable cervical or supraclavicular adenopathy.  Shotty pea-sized adenopathy noted in bilateral axilla.  No palpable inguinal adenopathy.  Skin:    General: Skin is warm and dry.  Neurological:     Mental Status: She is alert and oriented to person, place, and time.           Latest Ref Rng & Units 09/27/2022    9:39 AM  CMP  Glucose 70 - 99 mg/dL 74   BUN 6 - 20 mg/dL  11   Creatinine 0.57 - 1.00 mg/dL 0.78   Sodium 134 - 144 mmol/L 140   Potassium 3.5 - 5.2 mmol/L 4.1   Chloride 96 - 106 mmol/L 104   CO2 20 - 29 mmol/L 21   Calcium 8.7 - 10.2 mg/dL 9.1   Total Protein 6.0 - 8.5 g/dL 7.2   Total Bilirubin 0.0 - 1.2 mg/dL 0.4   Alkaline Phos 44 - 121 IU/L 52   AST 0 - 40 IU/L 20   ALT 0 - 32 IU/L 15       Latest Ref Rng & Units 10/03/2022   11:42 AM  CBC  WBC 4.0 -  10.5 K/uL 5.1   Hemoglobin 12.0 - 15.0 g/dL 12.3   Hematocrit 36.0 - 46.0 % 37.4   Platelets 150 - 400 K/uL 222     No images are attached to the encounter.  US PELVIC COMPLETE WITH TRANSVAGINAL  Result Date: 09/24/2022 CLINICAL DATA:  Pelvic pain. EXAM: TRANSABDOMINAL AND TRANSVAGINAL ULTRASOUND OF PELVIS TECHNIQUE: Both transabdominal and transvaginal ultrasound examinations of the pelvis were performed. Transabdominal technique was performed for global imaging of the pelvis including uterus, ovaries, adnexal regions, and pelvic cul-de-sac. It was necessary to proceed with endovaginal exam following the transabdominal exam to visualize the adnexal structures. COMPARISON:  None Available. FINDINGS: Uterus Measurements: 7.6 x 4.8 x 5.0 cm = volume: 95.5 mL. There is a 1.1 x 1.6 x 0.8 cm intramural hypoechoic mass within the posterior right uterine fundus, likely a small fibroid. Endometrium Thickness: 13.9 mm.  No focal abnormality visualized. Right ovary Measurements: 3.2 x 1.6 x 1.8 cm = volume: 4.7 mL. Normal appearance/no adnexal mass. Left ovary Measurements: 3.3 x 1.4 x 2.4 cm = volume: 5.7 mL. Normal appearance/no adnexal mass. Other findings No abnormal free fluid. IMPRESSION: 1. No acute process within the pelvis. 2. Small uterine fibroid. Electronically Signed   By: Lovey Newcomer M.D.   On: 09/24/2022 13:12    Assessment and plan- Patient is a 27 y.o. 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  HIV hepatitis B and C testing was negative.  I am checking a CBC with differential, smear review B12 and folate today.  Patient has an ultrasound neck planned for tomorrow and I will follow-up on that.  If that is negative I am inclined to monitor her counts conservatively without the need for bone marrow biopsy.  She has bilateral knee size axillary adenopathy which I will continue to monitor as well and decide about a CT scan down the line if need be.   Thank you for this kind referral and the opportunity to participate in the care of this  Patient   Visit Diagnosis 1. Other neutropenia ([place])     Dr. Randa Evens, MD, MPH [facility] at [facility] [phone] 10/03/2022

## 2022-10-03 NOTE — Progress Notes (Signed)
Patient states that she does not have any concerns today.

## 2022-10-04 ENCOUNTER — Ambulatory Visit
Admission: RE | Admit: 2022-10-04 | Discharge: 2022-10-04 | Disposition: A | Discharge status: Home / Self Care | Payer: 59 | Source: Ambulatory Visit | Attending: Physician Assistant | Admitting: Physician Assistant

## 2022-10-04 DIAGNOSIS — R7989 Other specified abnormal findings of blood chemistry: Secondary | ICD-10-CM | POA: Diagnosis present

## 2022-10-04 DIAGNOSIS — N3941 Urge incontinence: Secondary | ICD-10-CM | POA: Insufficient documentation

## 2022-10-04 DIAGNOSIS — D729 Disorder of white blood cells, unspecified: Secondary | ICD-10-CM | POA: Insufficient documentation

## 2022-10-04 HISTORY — DX: Urge incontinence: N39.41

## 2022-10-04 HISTORY — DX: Other specified abnormal findings of blood chemistry: R79.89

## 2022-10-04 HISTORY — DX: Disorder of white blood cells, unspecified: D72.9

## 2022-10-04 LAB — ANA COMPREHENSIVE PANEL
Anti JO-1: <0.2 AI (ref 0.0–0.9)
Centromere Ab Screen: <0.2 AI (ref 0.0–0.9)
Chromatin Ab SerPl-aCnc: <0.2 AI (ref 0.0–0.9)
ENA SM Ab Ser-aCnc: <0.2 AI (ref 0.0–0.9)
Ribonucleic Protein: 0.8 AI (ref 0.0–0.9)
SSA (Ro) (ENA) Antibody, IgG: 0.2 AI (ref 0.0–0.9)
SSB (La) (ENA) Antibody, IgG: <0.2 AI (ref 0.0–0.9)
Scleroderma (Scl-70) (ENA) Antibody, IgG: <0.2 AI (ref 0.0–0.9)
ds DNA Ab: <1 IU/mL (ref 0–9)

## 2022-10-05 NOTE — Progress Notes (Signed)
Please, let pt know that her US thyroid showed no thyroid abnormalities which is reassuring. I saw your visit note with your oncology. "Will discuss if you will have any questions at your next FU" Mardene Speak, [facility], [place] [phone])

## 2022-10-15 ENCOUNTER — Other Ambulatory Visit: Payer: Self-pay | Admitting: Obstetrics and Gynecology

## 2022-10-15 ENCOUNTER — Encounter: Payer: Self-pay | Admitting: Urology

## 2022-10-15 ENCOUNTER — Ambulatory Visit (INDEPENDENT_AMBULATORY_CARE_PROVIDER_SITE_OTHER): Payer: 59 | Admitting: Urology

## 2022-10-15 ENCOUNTER — Encounter: Payer: Self-pay | Admitting: Obstetrics and Gynecology

## 2022-10-15 VITALS — BP 106/71 | HR 76 | Ht 66.0 in | Wt 125.0 lb

## 2022-10-15 DIAGNOSIS — N3281 Overactive bladder: Secondary | ICD-10-CM

## 2022-10-15 DIAGNOSIS — N3941 Urge incontinence: Secondary | ICD-10-CM

## 2022-10-15 LAB — MICROSCOPIC EXAMINATION: RBC, Urine: >30 /hpf — AB (ref 0–2)

## 2022-10-15 LAB — URINALYSIS, COMPLETE
Bilirubin, UA: NEGATIVE
Glucose, UA: NEGATIVE
Ketones, UA: NEGATIVE
Leukocytes,UA: NEGATIVE
Nitrite, UA: NEGATIVE
Specific Gravity, UA: 1.02 (ref 1.005–1.030)
Urobilinogen, Ur: 1 mg/dL (ref 0.2–1.0)
pH, UA: 7 (ref 5.0–7.5)

## 2022-10-15 LAB — BLADDER SCAN AMB NON-IMAGING: Scan Result: 2

## 2022-10-15 MED ORDER — TRANEXAMIC ACID 650 MG PO TABS: 1300.0000 mg | ORAL_TABLET | Freq: Three times a day (TID) | ORAL | 2 refills | Status: DC

## 2022-10-15 NOTE — Progress Notes (Signed)
   10/15/2022 10:49 AM   Emily Flowers July 31, 1996 [id number]  Referring provider: Osborne Oman, MD [address] [place],  [place] [address]  Chief Complaint  Patient presents with   Urinary Incontinence    HPI: Emily Flowers is a 27 y.o. female referred for evaluation of urge incontinence.  She presents today with her husband.  6 month history of urinary urgency with small-volume urge incontinence Wears liners but no pads Vaginal delivery approximately 1 year ago Denies dysuria, gross hematuria No flank, abdominal or pelvic pain Occasional UTIs Urinalysis by referring provider was negative and a urine culture was negative   PMH: Past Medical History:  Diagnosis Date   Acne    COVID-19 05/28/2019   Heart murmur     Surgical History: Past Surgical History:  Procedure Laterality Date   TONSILLECTOMY Bilateral 04/08/2020   Procedure: TONSILLECTOMY;  Surgeon: Beverly Gust, MD;  Location: [facility];  Service: ENT;  Laterality: Bilateral;   WISDOM TOOTH EXTRACTION      Home Medications:  Allergies as of 10/15/2022       Reactions   Sulfa Antibiotics    Unknown childhood reaction        Medication List        Accurate as of October 15, 2022 10:49 AM. If you have any questions, ask your nurse or doctor.          acetaminophen 500 MG tablet Commonly known as: TYLENOL Take by mouth.   betamethasone dipropionate 0.05 % lotion SMARTSIG:Sparingly Topical Twice Daily   cetirizine 10 MG chewable tablet Commonly known as: ZYRTEC Chew 10 mg by mouth daily. What changed: Another medication with the same name was removed. Continue taking this medication, and follow the directions you see here. Changed by: Abbie Sons, MD   fluticasone 50 MCG/ACT nasal spray Commonly known as: FLONASE Place into both nostrils daily.        Allergies:  Allergies  Allergen Reactions   Sulfa Antibiotics     Unknown childhood reaction    Family  History: Family History  Problem Relation Age of Onset   Diabetes Father    Cancer Maternal Aunt        breast   Diabetes Paternal Aunt    Diabetes Paternal Uncle     Social History:  reports that she has never smoked. She has never been exposed to tobacco smoke. She has never used smokeless tobacco. She reports that she does not drink alcohol and does not use drugs.   Physical Exam: BP 106/71   Pulse 76   Ht 5' 6"$  (1.676 m)   Wt 125 lb (56.7 kg)   LMP 09/09/2022   BMI 20.18 kg/m   Constitutional:  Alert, No acute distress. HEENT: [place] AT Respiratory: Normal respiratory effort, no increased work of breathing. Psychiatric: Normal mood and affect.  Laboratory Data:  Urinalysis Dipstick 3+ blood/1+ protein Microscopy >30 RBC (menstruating)  Assessment & Plan:    1.  Overactive bladder with urge incontinence PVR 2 mL Management options were discussed including pelvic floor physical therapy and medical management She has elected pelvic floor physical therapy and referral placed   Abbie Sons, [place] [address], [place],  [address] [phone]

## 2022-10-26 ENCOUNTER — Encounter: Payer: Self-pay | Admitting: Oncology

## 2022-10-26 ENCOUNTER — Inpatient Hospital Stay: Payer: 59 | Attending: Oncology | Admitting: Oncology

## 2022-10-26 DIAGNOSIS — D708 Other neutropenia: Secondary | ICD-10-CM | POA: Diagnosis not present

## 2022-10-26 NOTE — Progress Notes (Signed)
Patient has no concerns today for the provider.

## 2022-10-26 NOTE — Progress Notes (Signed)
I connected with Emily Flowers on 10/26/22 at 10:00 AM EST by video enabled telemedicine visit and verified that I am speaking with the correct person using two identifiers.   I discussed the limitations, risks, security and privacy concerns of performing an evaluation and management service by telemedicine and the availability of in-person appointments. I also discussed with the patient that there may be a patient responsible charge related to this service. The patient expressed understanding and agreed to proceed.  Other persons participating in the visit and their role in the encounter:  none  Patient's location:  home Provider's location:  work  Risk analyst Complaint:  routine f/u of neutropenia  History of present illness: -patient is a 27 year old African-American female who has been referred for leukopenia/neutropenia white cell count has been mainly fluctuating between 3.5-4.5.  In 2021 her white count did go to 2.9 and then bounced back to 3.5.  Most recent CBC from 09/27/2022 showed a white cell count of 2.1 with an H&H of 12.9/37.6 and a platelet count of 185.  Patient denies any over-the-counter herbal medications.  She was recently checked for HIV hep B and hep C as well which was all negative.   Interval history patient is doing well presently.  Fatigue is getting better.She was evaluated by urology for symptoms of urge incontinence which is improving.   Review of Systems  Constitutional:  Negative for chills, fever, malaise/fatigue and weight loss.  HENT:  Negative for congestion, ear discharge and nosebleeds.   Eyes:  Negative for blurred vision.  Respiratory:  Negative for cough, hemoptysis, sputum production, shortness of breath and wheezing.   Cardiovascular:  Negative for chest pain, palpitations, orthopnea and claudication.  Gastrointestinal:  Negative for abdominal pain, blood in stool, constipation, diarrhea, heartburn, melena, nausea and vomiting.  Genitourinary:  Negative for  dysuria, flank pain, frequency, hematuria and urgency.  Musculoskeletal:  Negative for back pain, joint pain and myalgias.  Skin:  Negative for rash.  Neurological:  Negative for dizziness, tingling, focal weakness, seizures, weakness and headaches.  Endo/Heme/Allergies:  Does not bruise/bleed easily.  Psychiatric/Behavioral:  Negative for depression and suicidal ideas. The patient does not have insomnia.     Allergies  Allergen Reactions   Sulfa Antibiotics     Unknown childhood reaction    Past Medical History:  Diagnosis Date   Acne    COVID-19 05/28/2019   Heart murmur     Past Surgical History:  Procedure Laterality Date   TONSILLECTOMY Bilateral 04/08/2020   Procedure: TONSILLECTOMY;  Surgeon: Beverly Gust, MD;  Location: [facility];  Service: ENT;  Laterality: Bilateral;   WISDOM TOOTH EXTRACTION      Social History   Socioeconomic History   Marital status: Married    Spouse name: Not on file   Number of children: Not on file   Years of education: Not on file   Highest education level: Not on file  Occupational History   Not on file  Tobacco Use   Smoking status: Never    Passive exposure: Never   Smokeless tobacco: Never  Vaping Use   Vaping Use: Never used  Substance and Sexual Activity   Alcohol use: No    Alcohol/week: 0.0 standard drinks of alcohol   Drug use: No   Sexual activity: Yes    Partners: Male    Birth control/protection: Condom    Comment: orsynthia  Other Topics Concern   Not on file  Social History Narrative   ** Merged History  Encounter **       Social Determinants of Health   Financial Resource Strain: Not on file  Food Insecurity: No Food Insecurity (10/03/2022)   Hunger Vital Sign    Worried About Running Out of Food in the Last Year: Never true    Ran Out of Food in the Last Year: Never true  Transportation Needs: No Transportation Needs (10/03/2022)   PRAPARE - Hydrologist (Medical):  No    Lack of Transportation (Non-Medical): No  Physical Activity: Not on file  Stress: Not on file  Social Connections: Not on file  Intimate Partner Violence: Not At Risk (10/03/2022)   Humiliation, Afraid, Rape, and Kick questionnaire    Fear of Current or Ex-Partner: No    Emotionally Abused: No    Physically Abused: No    Sexually Abused: No    Family History  Problem Relation Age of Onset   Diabetes Father    Cancer Maternal Aunt        breast   Diabetes Paternal Aunt    Diabetes Paternal Uncle      Current Outpatient Medications:    acetaminophen (TYLENOL) 500 MG tablet, Take by mouth., Disp: , Rfl:    betamethasone dipropionate 0.05 % lotion, SMARTSIG:Sparingly Topical Twice Daily, Disp: , Rfl:    cetirizine (ZYRTEC) 10 MG chewable tablet, Chew 10 mg by mouth daily., Disp: , Rfl:    fluticasone (FLONASE) 50 MCG/ACT nasal spray, Place into both nostrils daily., Disp: , Rfl:    tranexamic acid (LYSTEDA) 650 MG TABS tablet, Take 2 tablets (1,300 mg total) by mouth 3 (three) times daily. Take during menses for a maximum of five days, Disp: 30 tablet, Rfl: 2  US THYROID  Result Date: 10/04/2022 CLINICAL DATA:  Palpable abnormality. EXAM: THYROID ULTRASOUND TECHNIQUE: Ultrasound examination of the thyroid gland and adjacent soft tissues was performed. COMPARISON:  None Available. FINDINGS: Parenchymal Echotexture: Normal Isthmus: 0.2 cm Right lobe: 3.9 x 1.4 x 1.1 cm Left lobe: 3.3 x 1.2 x 1.1 cm _________________________________________________________ Estimated total number of nodules >/= 1 cm: 0 Number of spongiform nodules >/=  2 cm not described below (TR1): 0 Number of mixed cystic and solid nodules >/= 1.5 cm not described below (TR2): 0 _________________________________________________________ No discrete nodules are seen within the thyroid gland. IMPRESSION: Normal sonographic appearance of the thyroid gland. The above is in keeping with the ACR TI-RADS recommendations - J Am  Coll Radiol 2017;14:587-595. Electronically Signed   By: Jacqulynn Cadet M.D.   On: 10/04/2022 12:57    No images are attached to the encounter.      Latest Ref Rng & Units 09/27/2022    9:39 AM  CMP  Glucose 70 - 99 mg/dL 74   BUN 6 - 20 mg/dL 11   Creatinine 0.57 - 1.00 mg/dL 0.78   Sodium 134 - 144 mmol/L 140   Potassium 3.5 - 5.2 mmol/L 4.1   Chloride 96 - 106 mmol/L 104   CO2 20 - 29 mmol/L 21   Calcium 8.7 - 10.2 mg/dL 9.1   Total Protein 6.0 - 8.5 g/dL 7.2   Total Bilirubin 0.0 - 1.2 mg/dL 0.4   Alkaline Phos 44 - 121 IU/L 52   AST 0 - 40 IU/L 20   ALT 0 - 32 IU/L 15       Latest Ref Rng & Units 10/03/2022   11:42 AM  CBC  WBC 4.0 - 10.5 K/uL 5.1  Hemoglobin 12.0 - 15.0 g/dL 12.3   Hematocrit 36.0 - 46.0 % 37.4   Platelets 150 - 400 K/uL 222      Observation/objective: Appears in no acute distress over video visit.  Breathing is nonlabored  Assessment and plan: Patient is a 27 year old female and this is a routine follow-up visit for leukopenia/Neutropenia  Patient's white cell count has been mostly between 3.5-5.  A couple of times it has gone down to the twos.  She was noted to have a white count of 2.1 on 09/27/2022 but when checked a week later it was normal at 5.1.  No evidence of anemia or thrombocytopenia.  B12 folate was normal.  ANA comprehensive panel negative.  This can be monitored conservatively without the need for bone marrow biopsy.  I will repeat CBC with differential and 6 months and see her thereafter  Follow-up instructions: As above  I discussed the assessment and treatment plan with the patient. The patient was provided an opportunity to ask questions and all were answered. The patient agreed with the plan and demonstrated an understanding of the instructions.   The patient was advised to call back or seek an in-person evaluation if the symptoms worsen or if the condition fails to improve as anticipated.  I provided 11 minutes of face-to-face  video visit time during this encounter, and > 50% was spent counseling as documented under my assessment & plan.  Visit Diagnosis: 1. Other neutropenia ([place])     Dr. Randa Evens, MD, MPH [facility] at [facility] Tel- [phone] 10/26/2022 12:59 PM

## 2022-11-12 ENCOUNTER — Ambulatory Visit: Payer: Medicaid Other

## 2022-11-12 VITALS — BP 101/68 | HR 83 | Wt 126.0 lb

## 2022-11-12 DIAGNOSIS — N912 Amenorrhea, unspecified: Secondary | ICD-10-CM | POA: Diagnosis not present

## 2022-11-12 LAB — POCT URINE PREGNANCY: Preg Test, Ur: POSITIVE — AB

## 2022-11-12 NOTE — Progress Notes (Signed)
[facility] here for a UPT. Pt had 2 positive upt at home. LMP is 10/10/22.     UPT in office Positive.    Reviewed medications and informed to start a PNV, if not already. Pt to follow up in 4 weeks for New OB interview visit.

## 2022-11-15 ENCOUNTER — Encounter: Payer: Self-pay | Admitting: Family Medicine

## 2022-11-29 ENCOUNTER — Telehealth: Payer: Self-pay | Admitting: Family Medicine

## 2022-12-04 ENCOUNTER — Other Ambulatory Visit (INDEPENDENT_AMBULATORY_CARE_PROVIDER_SITE_OTHER): Payer: Medicaid Other

## 2022-12-04 ENCOUNTER — Ambulatory Visit (INDEPENDENT_AMBULATORY_CARE_PROVIDER_SITE_OTHER): Payer: Medicaid Other | Admitting: *Deleted

## 2022-12-04 VITALS — BP 114/73 | HR 74 | Wt 132.0 lb

## 2022-12-04 DIAGNOSIS — Z3481 Encounter for supervision of other normal pregnancy, first trimester: Secondary | ICD-10-CM

## 2022-12-04 DIAGNOSIS — Z3A01 Less than 8 weeks gestation of pregnancy: Secondary | ICD-10-CM

## 2022-12-04 DIAGNOSIS — O3680X0 Pregnancy with inconclusive fetal viability, not applicable or unspecified: Secondary | ICD-10-CM

## 2022-12-04 DIAGNOSIS — Z3480 Encounter for supervision of other normal pregnancy, unspecified trimester: Secondary | ICD-10-CM

## 2022-12-04 NOTE — Progress Notes (Signed)
New OB Intake  I explained I am completing New OB Intake today. We discussed her EDD of 07/14/23 that is based on today's scan.Marland Kitchen LMP of 10/10/22. Pt is G2/P1. I reviewed her allergies, medications, Medical/Surgical/OB history, and appropriate screenings.   Patient Active Problem List   Diagnosis Date Noted   Supervision of other normal pregnancy, antepartum 12/04/2022    Concerns addressed today  Delivery Plans:  Plans to deliver at [facility].   MyChart/Babyscripts MyChart access verified. I explained pt will have some visits in office and some virtually.   Blood Pressure Cuff  BP cuff  pt has one at home  Discussed to be used for virtual visits and or if needed BP checks weekly.    Anatomy US Explained first scheduled [place] will be around 19 weeks.   Labs Discussed Avelina Laine genetic screening with patient. . Routine prenatal labs needed.   Placed OB Box on problem list and updated   Patient informed that the ultrasound is considered a limited obstetric ultrasound and is not intended to be a complete ultrasound exam.  Patient also informed that the ultrasound is not being completed with the intent of assessing for fetal or placental anomalies or any pelvic abnormalities. Explained that the purpose of today's ultrasound is to assess for dating and fetal heart rate.  Patient acknowledges the purpose of the exam and the limitations of the study.      First visit review I reviewed new OB appt with pt. I explained she will have ob bloodwork with genetic screening,  Explained pt will be seen by Dr Alvester Morin at first visit.    Scheryl Marten, RN 12/04/2022  9:59 AM

## 2022-12-24 ENCOUNTER — Encounter: Payer: Self-pay | Admitting: Family Medicine

## 2022-12-28 ENCOUNTER — Ambulatory Visit (INDEPENDENT_AMBULATORY_CARE_PROVIDER_SITE_OTHER): Payer: Medicaid Other | Admitting: Family Medicine

## 2022-12-28 ENCOUNTER — Other Ambulatory Visit (HOSPITAL_COMMUNITY)
Admission: RE | Admit: 2022-12-28 | Discharge: 2022-12-28 | Disposition: A | Discharge status: Home / Self Care | Payer: Medicaid Other | Source: Ambulatory Visit | Attending: Family Medicine | Admitting: Family Medicine

## 2022-12-28 ENCOUNTER — Encounter: Payer: Self-pay | Admitting: Family Medicine

## 2022-12-28 VITALS — BP 107/72 | HR 69 | Wt 133.0 lb

## 2022-12-28 DIAGNOSIS — Z3480 Encounter for supervision of other normal pregnancy, unspecified trimester: Secondary | ICD-10-CM

## 2022-12-28 DIAGNOSIS — Z8639 Personal history of other endocrine, nutritional and metabolic disease: Secondary | ICD-10-CM

## 2022-12-28 NOTE — Progress Notes (Signed)
NOB  Pt planning to have a Doula.  Last pap: 09/18/22 WNL.  Genetic Screening:Desires does not want to know gender.   CC: Fatigue   Need assistance w/ FHT's.

## 2022-12-28 NOTE — Progress Notes (Signed)
INITIAL PRENATAL VISIT  Subjective:   Emily Flowers is being seen today for her first obstetrical visit.  This is a planned pregnancy. This is a desired pregnancy.  She is at [redacted]w[redacted]d gestation by LMP and early [place]. Her obstetrical history is significant for  nothing .  Pregnancy history fully reviewed.  Patient reports no complaints.  Indications for ASA therapy (per uptodate) One of the following: Previous pregnancy with preeclampsia, especially early onset and with an adverse outcome No Multifetal gestation No Chronic hypertension No Type 1 or 2 diabetes mellitus No Chronic kidney disease No Autoimmune disease (antiphospholipid syndrome, systemic lupus erythematosus) No  Two or more of the following: Nulliparity No Obesity (body mass index >30 kg/m2) No Family history of preeclampsia in mother or sister No Age ?35 years No Sociodemographic characteristics (African American race, low socioeconomic level) Yes Personal risk factors (eg, previous pregnancy with low birth weight or small for gestational age infant, previous adverse pregnancy outcome [eg, stillbirth], interval >10 years between pregnancies) No  Indications for early GDM screening  First-degree relative with diabetes Yes BMI >30kg/m2 No Age > 25 Yes Previous birth of an infant weighing ?4000 g No Gestational diabetes mellitus in a previous pregnancy No Glycated hemoglobin ?5.7 percent (39 mmol/mol), impaired glucose tolerance, or impaired fasting glucose on previous testing No High-risk race/ethnicity (eg, African American, Latino, Native American, Asian American, Pacific Islander) Yes Previous stillbirth of unknown cause No Maternal birthweight > 9 lbs No History of cardiovascular disease No Hypertension or on therapy for hypertension No High-density lipoprotein cholesterol level <35 mg/dL (5.73 mmol/L) and/or a triglyceride level >250 mg/dL (2.20 mmol/L) No Polycystic ovary syndrome No Physical inactivity  No Other clinical condition associated with insulin resistance (eg, severe obesity, acanthosis nigricans) No Current use of glucocorticoids No   Early screening tests: FBS, A1C, Random CBG, glucose challenge   Review of Systems:   Review of Systems  Objective:    Obstetric History OB History  Gravida Para Term Preterm AB Living  2 1 1     1   SAB IAB Ectopic Multiple Live Births          1    # Outcome Date GA Lbr Len/2nd Weight Sex Delivery Anes PTL Lv  2 Current           1 Term 10/02/21    F Vag-Spont None      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 (Patient not taking: Reported on 11/12/2022)     betamethasone dipropionate 0.05 % lotion SMARTSIG:Sparingly Topical Twice Daily (Patient not taking: Reported on 11/12/2022)     cetirizine (ZYRTEC) 10 MG chewable tablet Chew 10 mg by mouth daily.     fluticasone (FLONASE) 50 MCG/ACT nasal spray Place into both nostrils daily. (Patient not taking: Reported on 11/12/2022)  Prenatal Vit-Fe Fumarate-FA (MULTIVITAMIN-PRENATAL) 27-0.8 MG TABS tablet Take 1 tablet by mouth daily at 12 noon.     tranexamic acid (LYSTEDA) 650 MG TABS tablet Take 2 tablets (1,300 mg total) by mouth 3 (three) times daily. Take during menses for a maximum of five days (Patient not taking: Reported on  11/12/2022) 30 tablet 2   No current facility-administered medications on file prior to visit.    Allergies  Allergen Reactions   Sulfa Antibiotics     Unknown childhood reaction    Social History:  reports that she has never smoked. She has never been exposed to tobacco smoke. She has never used smokeless tobacco. She reports that she does not drink alcohol and does not use drugs.  Family History  Problem Relation Age of Onset   Diabetes Father    Cancer Maternal Aunt        breast   Diabetes Paternal Aunt    Diabetes Paternal Uncle     The following portions of the patient's history were reviewed and updated as appropriate: allergies, current medications, past family history, past medical history, past social history, past surgical history and problem list.  Review of Systems Review of Systems  Constitutional:  Negative for chills and fever.  HENT:  Negative for congestion and sore throat.   Eyes:  Negative for pain and visual disturbance.  Respiratory:  Negative for cough, chest tightness and shortness of breath.   Cardiovascular:  Negative for chest pain.  Gastrointestinal:  Negative for abdominal pain, diarrhea, nausea and vomiting.  Endocrine: Negative for cold intolerance and heat intolerance.  Genitourinary:  Negative for dysuria and flank pain.  Musculoskeletal:  Negative for back pain.  Skin:  Negative for rash.  Allergic/Immunologic: Negative for food allergies.  Neurological:  Negative for dizziness and light-headedness.  Psychiatric/Behavioral:  Negative for agitation.       Physical Exam:  BP 107/72   Pulse 69   Wt 133 lb (60.3 kg)   LMP 10/10/2022 (Exact Date)   BMI 21.47 kg/m  CONSTITUTIONAL: Well-developed, well-nourished female in no acute distress.  HENT:  Normocephalic, atraumatic.  Oropharynx is clear and moist EYES: Conjunctivae normal. No scleral icterus.  NECK: Normal range of motion  SKIN: Skin is warm and dry. No rash noted. Not  diaphoretic. No erythema. No pallor. MUSCULOSKELETAL: Normal range of motion. No tenderness.  No cyanosis, clubbing, or edema.   NEUROLOGIC: Alert and oriented to person, place, and time. Normal muscle tone coordination.  PSYCHIATRIC: Normal mood and affect. Normal behavior. Normal judgment and thought content. CARDIOVASCULAR: Normal heart rate noted, regular rhythm RESPIRATORY: Clear to auscultation bilaterally. Effort and breath sounds normal, no problems with respiration noted. BREASTS: Symmetric in size. No masses, skin changes, nipple drainage, or lymphadenopathy. ABDOMEN: Soft, normal bowel sounds, no distention noted.  No tenderness, rebound or guarding. Fundal ht: NA PELVIC: deferred      Movement: Absent       Assessment:    Pregnancy: G2P1001  1. Supervision of other normal pregnancy, antepartum - [place] MFM OB COMP + 14 WK; Future - CBC/D/Plt+RPR+Rh+ABO+RubIgG... - Culture, OB Urine - PANORAMA PRENATAL TEST FULL PANEL - HORIZON Basic Panel - Babyscripts Schedule Optimization - Urine cytology ancillary only  Plan:     Initial labs drawn. Prenatal vitamins. Problem list reviewed and updated. Reviewed in detail the nature of the practice with collaborative care between CNM, MD. Additionally discussed role of learners on our services  Also discussed home birth in [place]. She will  let me know if she wants a referral to a local group and she would be offered parallel care at [facility] or I would be comfortable providing this at [facility].   Genetic screening discussed: NIPS ordered. Role of ultrasound in pregnancy discussed; Anatomy US: ordered. Amniocentesis discussed: not indicated. Follow up in 4 weeks. Discussed clinic routines, schedule of care and testing, genetic screening options, involvement of students and residents under the direct supervision of APPs and doctors and presence of female providers. Pt verbalized understanding.  Future Appointments  Date Time Provider Department  Center  01/23/2023  8:55 AM Reva Bores, MD [facility]  01/29/2023 11:00 AM Sherren Kerns, PT [facility] None  02/05/2023 11:00 AM Sherren Kerns, PT [facility] None  02/12/2023 11:00 AM Sherren Kerns, PT [facility] None  02/19/2023 11:00 AM Sherren Kerns, PT [facility] None  02/20/2023  8:35 AM Reva Bores, MD [facility]  02/26/2023 11:00 AM Sherren Kerns, PT [facility] None  02/27/2023 10:15 AM [facility] NURSE [facility]  02/27/2023 10:30 AM [facility] US3 [facility]  03/05/2023 11:00 AM Sherren Kerns, PT [facility] None  03/12/2023 11:00 AM Sherren Kerns, PT [facility] None  03/19/2023 11:00 AM Sherren Kerns, PT [facility] None  04/09/2023 11:00 AM Sherren Kerns, PT [facility] None  04/16/2023 11:00 AM Sherren Kerns, PT [facility] None  04/30/2023  9:15 AM [facility] None  04/30/2023  9:30 AM Creig Hines, MD [facility] None     Federico Flake, MD 12/28/2022 10:42 AM

## 2022-12-29 LAB — CBC/D/PLT+RPR+RH+ABO+RUBIGG...
Antibody Screen: NEGATIVE
Basophils Absolute: 0.1 10*3/uL (ref 0.0–0.2)
Basos: 1 %
EOS (ABSOLUTE): 0.2 10*3/uL (ref 0.0–0.4)
Eos: 5 %
HCV Ab: NONREACTIVE
HIV Screen 4th Generation wRfx: NONREACTIVE
Hematocrit: 39.6 % (ref 34.0–46.6)
Hemoglobin: 13.2 g/dL (ref 11.1–15.9)
Hepatitis B Surface Ag: NEGATIVE
Immature Grans (Abs): 0 10*3/uL (ref 0.0–0.1)
Immature Granulocytes: 0 %
Lymphocytes Absolute: 1.7 10*3/uL (ref 0.7–3.1)
Lymphs: 34 %
MCH: 31.2 pg (ref 26.6–33.0)
MCHC: 33.3 g/dL (ref 31.5–35.7)
MCV: 94 fL (ref 79–97)
Monocytes Absolute: 0.3 10*3/uL (ref 0.1–0.9)
Monocytes: 7 %
Neutrophils Absolute: 2.7 10*3/uL (ref 1.4–7.0)
Neutrophils: 53 %
Platelets: 226 10*3/uL (ref 150–450)
RBC: 4.23 x10E6/uL (ref 3.77–5.28)
RDW: 14 % (ref 11.7–15.4)
RPR Ser Ql: NONREACTIVE
Rh Factor: POSITIVE
Rubella Antibodies, IGG: 3.55 index (ref >0.99)
WBC: 5.1 10*3/uL (ref 3.4–10.8)

## 2022-12-29 LAB — HEMOGLOBIN A1C
Est. average glucose Bld gHb Est-mCnc: 108 mg/dL
Hgb A1c MFr Bld: 5.4 % (ref 4.8–5.6)

## 2022-12-29 LAB — HCV INTERPRETATION

## 2022-12-29 LAB — TSH+FREE T4
Free T4: 1.22 ng/dL (ref 0.82–1.77)
TSH: 1.06 u[IU]/mL (ref 0.450–4.500)

## 2022-12-30 LAB — URINE CULTURE, OB REFLEX

## 2022-12-30 LAB — CULTURE, OB URINE

## 2022-12-31 LAB — URINE CYTOLOGY ANCILLARY ONLY
Chlamydia: NEGATIVE
Comment: NEGATIVE
Comment: NORMAL
Neisseria Gonorrhea: NEGATIVE

## 2022-12-31 NOTE — Addendum Note (Signed)
Addended by: Geanie Berlin on: 12/31/2022 04:17 PM   Modules accepted: Orders

## 2023-01-01 ENCOUNTER — Encounter: Payer: Self-pay | Admitting: Family Medicine

## 2023-01-05 LAB — PANORAMA PRENATAL TEST FULL PANEL:PANORAMA TEST PLUS 5 ADDITIONAL MICRODELETIONS: FETAL FRACTION: 12.2

## 2023-01-09 ENCOUNTER — Encounter: Payer: Self-pay | Admitting: Family Medicine

## 2023-01-09 LAB — HORIZON CUSTOM: REPORT SUMMARY: POSITIVE — AB

## 2023-01-15 ENCOUNTER — Encounter: Payer: Self-pay | Admitting: Family Medicine

## 2023-01-15 DIAGNOSIS — Z148 Genetic carrier of other disease: Secondary | ICD-10-CM | POA: Insufficient documentation

## 2023-01-23 ENCOUNTER — Encounter: Payer: Self-pay | Admitting: Family Medicine

## 2023-01-23 ENCOUNTER — Ambulatory Visit (INDEPENDENT_AMBULATORY_CARE_PROVIDER_SITE_OTHER): Payer: Medicaid Other | Admitting: Family Medicine

## 2023-01-23 VITALS — BP 100/63 | HR 67 | Wt 137.0 lb

## 2023-01-23 DIAGNOSIS — Z3480 Encounter for supervision of other normal pregnancy, unspecified trimester: Secondary | ICD-10-CM

## 2023-01-23 DIAGNOSIS — Z148 Genetic carrier of other disease: Secondary | ICD-10-CM

## 2023-01-23 NOTE — Progress Notes (Signed)
ROB   Pt has questions and concerns regarding Travel in 05/2023.  Wants to discuss Horizon results. Positive for Spinal Muscular Atrophy

## 2023-01-23 NOTE — Progress Notes (Signed)
   PRENATAL VISIT NOTE  Subjective:  Emily Flowers is a 27 y.o. G2P1001 at [redacted]w[redacted]d being seen today for ongoing prenatal care.  She is currently monitored for the following issues for this low-risk pregnancy and has Supervision of other normal pregnancy, antepartum and Carrier of spinal muscular atrophy on their problem list.  Patient reports no complaints.  Contractions: Not present. Vag. Bleeding: None.  Movement: Present. Denies leaking of fluid.   The following portions of the patient's history were reviewed and updated as appropriate: allergies, current medications, past family history, past medical history, past social history, past surgical history and problem list.   Objective:   Vitals:   01/23/23 0905  BP: 100/63  Pulse: 67  Weight: 137 lb (62.1 kg)    Fetal Status: Fetal Heart Rate (bpm): 144   Movement: Present     General:  Alert, oriented and cooperative. Patient is in no acute distress.  Skin: Skin is warm and dry. No rash noted.   Cardiovascular: Normal heart rate noted  Respiratory: Normal respiratory effort, no problems with respiration noted  Abdomen: Soft, gravid, appropriate for gestational age.  Pain/Pressure: Absent     Pelvic: Cervical exam deferred        Extremities: Normal range of motion.  Edema: None  Mental Status: Normal mood and affect. Normal behavior. Normal judgment and thought content.   Assessment and Plan:  Pregnancy: G2P1001 at [redacted]w[redacted]d 1. Supervision of other normal pregnancy, antepartum Continue routine prenatal care.  2. Carrier of spinal muscular atrophy Offered partner testing  Preterm labor symptoms and general obstetric precautions including but not limited to vaginal bleeding, contractions, leaking of fluid and fetal movement were reviewed in detail with the patient. Please refer to After Visit Summary for other counseling recommendations.   Return in 4 weeks (on 02/20/2023).  Future Appointments  Date Time Provider Department  Center  01/29/2023 11:00 AM Zerita Boers L, PT [facility] None  02/05/2023 11:00 AM Sherren Kerns, PT [facility] None  02/12/2023 11:00 AM Sherren Kerns, PT [facility] None  02/19/2023 11:00 AM Sherren Kerns, PT [facility] None  02/20/2023  8:35 AM Reva Bores, MD [facility]  02/26/2023 11:00 AM Sherren Kerns, PT [facility] None  02/27/2023 10:15 AM [facility]SE [facility]  02/27/2023 10:30 AM [facility] US3 [facility]  03/05/2023 11:00 AM Sherren Kerns, PT [facility] None  03/12/2023 11:00 AM Sherren Kerns, PT [facility] None  03/19/2023 11:00 AM Sherren Kerns, PT [facility] None  03/20/2023  8:55 AM Reva Bores, MD [facility]  04/09/2023 11:00 AM Sherren Kerns, PT [facility] None  04/16/2023 11:00 AM Sherren Kerns, PT [facility] None  04/30/2023  9:15 AM [facility] None  04/30/2023  9:30 AM Creig Hines, MD [facility] None    Reva Bores, MD

## 2023-01-29 ENCOUNTER — Ambulatory Visit: Payer: Medicaid Other | Attending: Urology

## 2023-01-29 ENCOUNTER — Other Ambulatory Visit: Payer: Self-pay

## 2023-01-29 ENCOUNTER — Encounter: Payer: Self-pay | Admitting: Family Medicine

## 2023-01-29 DIAGNOSIS — R2689 Other abnormalities of gait and mobility: Secondary | ICD-10-CM | POA: Insufficient documentation

## 2023-01-29 DIAGNOSIS — N3281 Overactive bladder: Secondary | ICD-10-CM | POA: Diagnosis not present

## 2023-01-29 DIAGNOSIS — N3946 Mixed incontinence: Secondary | ICD-10-CM | POA: Diagnosis present

## 2023-01-29 DIAGNOSIS — M6281 Muscle weakness (generalized): Secondary | ICD-10-CM | POA: Insufficient documentation

## 2023-01-29 DIAGNOSIS — N3941 Urge incontinence: Secondary | ICD-10-CM | POA: Diagnosis not present

## 2023-01-29 NOTE — Therapy (Unsigned)
OUTPATIENT PHYSICAL THERAPY FEMALE PELVIC EVALUATION   Patient Name: Emily Flowers MRN: [id number] DOB:03/25/1996, 27 y.o., female Today's Date: 01/29/2023  END OF SESSION:  PT End of Session - 01/29/23 1113     Visit Number 1    Number of Visits 9    Date for PT Re-Evaluation 03/30/23    Authorization Type [employer] and [employer]    Progress Note Due on Visit 10    PT Start Time 1109    PT Stop Time 1145    PT Time Calculation (min) 36 min    Activity Tolerance Patient tolerated treatment well    Behavior During Therapy [facility] for tasks assessed/performed             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 Andi Hence per pt  REFERRING PROVIDER: Dr. Irineo Axon  REFERRING DIAG: Urge incontinence  THERAPY DIAG:  Mixed incontinence  Muscle weakness (generalized)  Other abnormalities of gait and mobility  Rationale for Evaluation and Treatment: Rehabilitation  ONSET DATE: 10/15/22 referral date  SUBJECTIVE:                                                                                                                                                                                           SUBJECTIVE STATEMENT: Pt reported incontinence began about six months postpartum, around 03/2023,  dtr born in 09/2021. 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 Her dtr was low the entire pregnancy. URINARY:  She noticed a swift jog would cause discomfort in pelvis. She has urge incontinence when trying to get to the bathroom and SUI with sneezing. Pt wears a pantyliner at times, usually 1-2 a day. She has more of a trickle vs. [place]. Pt did a lot of walking during last pregnancy. Sexual function: especially with initial penetration and different positions decr. Pain. Pt is [redacted] weeks pregnant.  Bowel movements: she had  IBS a few years ago and chiropractor care assisted s/s. Pt has a BM every day to every 2-3 days. She sometimes has constipation vs. Loose stools. Pt does not take fiber supplements but does drink a lot of water-approx. 3 Stanely's a day. Core stability: no issues that she's noticed.   Fluid intake: Yes: drinks sweet tea and juice (orange, apple) and then water, occasional coffee.    PAIN:  Are you having pain? No NPRS scale: 0/10 Pain location:  none  Pain type: none Pain description:  none    Aggravating factors: n/a Relieving factors: n/a  PRECAUTIONS: None  WEIGHT BEARING RESTRICTIONS: No  FALLS:  Has patient fallen in last 6 months? 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  PLOF: Independent  PATIENT GOALS: I would like a stronger pelvic floor and stopped leakage and regular bowel movements and be able to exercise without discomfort.  PERTINENT HISTORY:  Heart murmur and chart states hyperthyroidism but pt states that is incorrect. Sexual abuse: No  BOWEL MOVEMENT: Pain with bowel movement: Yes Type of bowel movement:Frequency every day to every 2-3  days. Fully empty rectum: No not always Leakage: No Pads: No Fiber supplement: No  URINATION: Pain with urination: No Fully empty bladder: Yes:   Stream:  it varies, she feels like the stream is weak at times. Urgency: Yes: with leakage Frequency: voids 1-2 times every 2-4 hours. Leakage: Urge to void, Walking to the bathroom, and Sneezing Pads: Yes: 1-2 pantyliners/day  INTERCOURSE: Pain with intercourse: Initial Penetration Ability to have vaginal penetration:  Yes:   Climax: no Marinoff Scale: 0/3  PREGNANCY: Vaginal deliveries: one Tearing Yes: small tear C-section deliveries: none Currently pregnant Yes: 15 weeks  PROLAPSE: None   OBJECTIVE:   DIAGNOSTIC FINDINGS:  N/a  PATIENT SURVEYS:   COGNITION: Overall cognitive status: Within functional limits for tasks assessed     SENSATION: Appears intatct  GAIT: Distance walked: 31' Assistive device utilized: None Level of assistance: Complete Independence Comments: decr. Trunk rotation  POSTURE: increased lumbar lordosis, decreased thoracic kyphosis, and anterior pelvic tilt  PELVIC ALIGNMENT: ant. Pelvic tilt, no obliquity noted  LUMBARAROM/PROM: will assess ROM and MMT next session  A/PROM A/PROM  eval  Flexion   Extension   Right lateral flexion   Left lateral flexion   Right rotation   Left rotation    (Blank rows = not tested)  LOWER EXTREMITY ROM:  Active ROM Right eval Left eval  Hip flexion    Hip extension    Hip abduction    Hip adduction    Hip internal rotation    Hip external rotation    Knee flexion    Knee extension    Ankle dorsiflexion    Ankle plantarflexion    Ankle inversion    Ankle eversion     (Blank rows = not tested)  LOWER EXTREMITY MMT:  MMT Right eval Left eval  Hip flexion    Hip extension    Hip abduction    Hip adduction    Hip internal rotation    Hip external rotation    Knee flexion    Knee extension    Ankle dorsiflexion    Ankle  plantarflexion  Ankle inversion    Ankle eversion     PALPATION:   General  not assessed 2/2 time constraints                External Perineal Exam                              Internal Pelvic Floor    PELVIC MMT:   MMT eval  Vaginal   Internal Anal Sphincter   External Anal Sphincter   Puborectalis   Diastasis Recti   (Blank rows = not tested)        TONE:   PROLAPSE:   TODAY'S TREATMENT:                                                                                                                              DATE: 01/29/23  EVAL    PATIENT EDUCATION:  Education details: PT educated pt on PT frequency, POC, duration. PT educated pt on proper posture, toileting posture, drinking 8 oz. Of water in the morning upon waking. Person educated: Patient and Spouse Education method: Explanation, Demonstration, Verbal cues, and Handouts Education comprehension: verbalized understanding, returned demonstration, and needs further education  HOME EXERCISE PROGRAM: Not yet established.  ASSESSMENT:  CLINICAL IMPRESSION: Patient is a pleasant 27 y.o. female ([redacted] weeks pregnant) who was seen today for physical therapy evaluation and treatment for incontinence, bowel issues, and dyspareunia. Pt's PMH is significant for the following: Heart murmur and chart states hyperthyroidism but pt states that is incorrect. The following impairments were noted upon exam: gait deviations, incontinence (urge and stress), irregular bowel movements, pain with initial penetration, postural dysfunction (decr. Tx spine kyphosis, incr. Lx spine lordosis, ant. Pelvic tilt), decr. ROM during amb. (Decr. Trunk rotation), and decr. Strength likely based on subjective reports. Pt would benefit from skilled PT to improve deficits listed above during all ADLs.  OBJECTIVE IMPAIRMENTS: Abnormal gait, decreased coordination, decreased endurance, decreased ROM, decreased strength, increased fascial restrictions,  impaired flexibility, improper body mechanics, postural dysfunction, and pain.   ACTIVITY LIMITATIONS: carrying, lifting, bending, squatting, continence, locomotion level, and caring for others  PARTICIPATION LIMITATIONS: interpersonal relationship, community activity, and occupation  PERSONAL FACTORS: 1-2 comorbidities: see above  are also affecting patient's functional outcome.   REHAB POTENTIAL: Good  CLINICAL DECISION MAKING: Stable/uncomplicated  EVALUATION COMPLEXITY: Low   GOALS: Goals reviewed with patient? Yes  SHORT TERM GOALS: Target date: for all STGs: 02/26/23  Pt will be IND in HEP to improve strength, posture, and flexibility. Baseline: no HEP Goal status: INITIAL  2.  Finish exam and write goals as indicated. Baseline: limited 2/2 time constraints Goal status: INITIAL  3.  Complete FOTO and write goals as indicated. Baseline: not performed Goal status: INITIAL  4.  Pt will demo proper toileting posture to fully empty bladder and reduce strain during bowel movements. Baseline: unable to demo Goal status:  INITIAL  5.  Assess running and write goal. Baseline: limited 2/2 time constraints Goal status: INITIAL   LONG TERM GOALS: Target date: for all LTGs: 03/26/23  Pt will demonstrate improvement of pelvic floor muscles (PFM) contraction and relaxation with coordination of breath to report no leakage and wearing <1 pad/day in last two weeks. Baseline: 1-2 pads/day Goal status: INITIAL  2.  Pt will be IND in performing and verbalizing birthing prep activities. Baseline: unable to perform Goal status: INITIAL  3.  Pt will demonstrate improved hip/LE strength and PFM relaxation techniques to decr. Pain during intercourse to improve QOL. Baseline: pain with initial penetration. Goal status: INITIAL   PLAN: finish exam, MMT, DR, ROM, palpation  PT FREQUENCY: 1x/week  PT DURATION: 8 weeks  PLANNED INTERVENTIONS: Therapeutic exercises, Therapeutic  activity, Neuromuscular re-education, Balance training, Gait training, Patient/Family education, Self Care, Joint mobilization, Joint manipulation, Dry Needling, Spinal manipulation, Spinal mobilization, Taping, Biofeedback, Manual therapy, and Re-evaluation  PLAN FOR NEXT SESSION: see above   Emily Flowers L, PT 01/29/2023, 11:15 AM  Emily Flowers, PT,DPT 01/29/23 11:15 AM Phone: [phone] Fax: [phone]

## 2023-01-29 NOTE — Patient Instructions (Signed)
TOILET POSTURE: Urination: feet flat, lean forward with forearms on legs to fully empty bladder. Bowel movement: place feet flat on Squatty Potty or stool so knees are higher than hips, lean forward to relax pelvic floor in order to avoid strain.  WATER: Drink 8 ounces of water first thing in the morning.  POSTURE: Try not to cross legs at knees or ankles.

## 2023-02-05 ENCOUNTER — Ambulatory Visit: Payer: Medicaid Other

## 2023-02-05 ENCOUNTER — Other Ambulatory Visit: Payer: Self-pay

## 2023-02-05 DIAGNOSIS — N3946 Mixed incontinence: Secondary | ICD-10-CM

## 2023-02-05 DIAGNOSIS — M6281 Muscle weakness (generalized): Secondary | ICD-10-CM

## 2023-02-05 DIAGNOSIS — R2689 Other abnormalities of gait and mobility: Secondary | ICD-10-CM

## 2023-02-05 NOTE — Therapy (Addendum)
OUTPATIENT PHYSICAL THERAPY FEMALE PELVIC TREATMENT   Patient Name: Emily Flowers MRN: [id number] DOB:Aug 07, 1996, 27 y.o., female Today's Date: 02/05/2023  END OF SESSION:  PT End of Session - 02/05/23 1109     Visit Number 2    Number of Visits 9    Date for PT Re-Evaluation 03/30/23    Authorization Type [employer] and [employer]    Progress Note Due on Visit 10    PT Start Time 1107    PT Stop Time 1145    PT Time Calculation (min) 38 min    Activity Tolerance Patient tolerated treatment well    Behavior During Therapy [facility] for tasks assessed/performed             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 Andi Hence per pt  REFERRING PROVIDER: Dr. Irineo Axon  REFERRING DIAG: Urge incontinence  THERAPY DIAG:  Mixed incontinence  Muscle weakness (generalized)  Other abnormalities of gait and mobility  Rationale for Evaluation and Treatment: Rehabilitation  ONSET DATE: 10/15/22 referral date  SUBJECTIVE:                                                                                                                                                                                           SUBJECTIVE STATEMENT: Pt reported she's been performing toileting posture and overall postural  activities.     PAIN:  Are you having pain? No 02/05/23 NPRS scale: 0/10 Pain location:  none  Pain type: none Pain description:  none    Aggravating factors: n/a Relieving factors: n/a  PRECAUTIONS: None  WEIGHT BEARING RESTRICTIONS: No  FALLS:  Has patient fallen in last 6 months? No  LIVING ENVIRONMENT: Lives with: lives with their family, lives with their spouse, and lives with their daughter Lives in: House/apartment Stairs: Yes: Internal: 15 steps; can reach both and External: 3 steps; can reach both Has following equipment at home: None  OCCUPATION: works part-time: Pharmacist, community, doula.  PLOF: Independent  PATIENT GOALS: I would like a stronger pelvic floor and stopped leakage and regular bowel movements and be able to exercise without discomfort.  PERTINENT HISTORY:  Heart murmur and chart states hyperthyroidism but pt states that is incorrect. Sexual abuse: No  BOWEL MOVEMENT: Pain with bowel movement: Yes Type of bowel movement:Frequency every day to every 2-3 days. Fully empty rectum: No not always Leakage: No Pads: No Fiber supplement: No  URINATION: Pain with urination: No Fully empty bladder: Yes:   Stream:  it varies, she feels like the stream is weak at times. Urgency: Yes: with leakage Frequency: voids 1-2 times every 2-4 hours. Leakage: Urge to void, Walking to the bathroom, and Sneezing Pads: Yes: 1-2 pantyliners/day  INTERCOURSE: Pain with intercourse: Initial Penetration Ability to have vaginal penetration:  Yes:   Climax: no Marinoff Scale: 0/3  PREGNANCY: Vaginal deliveries: one Tearing Yes: small tear C-section deliveries: none Currently pregnant Yes: 15 weeks  PROLAPSE: None   OBJECTIVE:   DIAGNOSTIC FINDINGS:  N/a  PATIENT SURVEYS:   COGNITION: Overall cognitive status: Within functional limits for tasks assessed     SENSATION: Appears intatct  GAIT: Distance walked: 48' Assistive device  utilized: None Level of assistance: Complete Independence Comments: decr. Trunk rotation  POSTURE: increased lumbar lordosis, decreased thoracic kyphosis, and anterior pelvic tilt  PELVIC ALIGNMENT: ant. Pelvic tilt, no obliquity noted  LUMBARAROM/PROM:   A/PROM A/PROM  eval  Flexion WFL  Extension WFL, 3/10 LBP  Right lateral flexion WFL with L low back pulling sensation  Left lateral flexion WFL  Right rotation WFL  Left rotation WFL   (Blank rows = not tested)  LOWER EXTREMITY ROM:  Active ROM Right eval Left eval  Hip flexion [facility]  Hip extension    Hip abduction    Hip adduction    Hip internal rotation    Hip external rotation    Knee flexion [facility] WFL  Knee extension [facility]  Ankle dorsiflexion [facility]  Ankle plantarflexion    Ankle inversion    Ankle eversion     (Blank rows = not tested)  LOWER EXTREMITY MMT:  MMT Right eval Left eval  Hip flexion 4/5 4/5  Hip extension    Hip abduction 2/5 2/5  Hip adduction 3/5 3/5  Hip internal rotation    Hip external rotation    Knee flexion 4/5 4/5  Knee extension 5/5 5/5  Ankle dorsiflexion 5/5 5/5  Ankle plantarflexion    Ankle inversion    Ankle eversion     PALPATION:  TTP over L piriformis and L hip rotators overall, incr. Tension in L add, L IT band, L quads. TTP over L SI jt area, incr. Paraspinal tension on L side.  Pt had difficulty coordinating PFM contraction without using B glutes or bearing down, improved with cues but incr. PFM likely.  Pt noted to have diastasis recti 2-3 fingers width sup. And at umbilicus with 3 fingers width at umbilicus. PT was mindful of pt being pregnant during palpation. Incr. Tension at diaphragm (B with L >R).  PELVIC MMT: See above.         PROLAPSE: not assessed.   TODAY'S TREATMENT:  DATE: 02/05/23   NMR: Access Code:  [email or web address] URL: [email or web address] Date: 02/05/2023 Prepared by: Zerita Boers  Exercises - Supine Diaphragmatic Breathing  - 1 x daily - 7 x weekly - 1 sets - 5 reps - Standing Pelvic Tilt  - 1 x daily - 7 x weekly - 1 sets - 10 reps and performed in seated position. Cues and demo for proper technique. S for safety.  PATIENT EDUCATION:  Education details: PT educated pt on further exam findings and IAP management to reduce leakage, pain, and DR. Person educated: Patient and Spouse Education method: Explanation, Demonstration, Verbal cues, and Handouts Education comprehension: verbalized understanding, returned demonstration, and needs further education  HOME EXERCISE PROGRAM: Medbridge: [id number].  ASSESSMENT:  CLINICAL IMPRESSION: Today's skilled session focused on finishing exam and establishing HEP. Pt found to have significant diastasis recti 2-3 fingers width sup. And inf. To umbilicus, which causes IAP issues which can lead to leakage and pain. Pt also has incr. B hip weakness and incr. LLE/hip tension. Pt had difficulty coordinating breath with PFM contraction and has incr. Diaphragm tension with difficulty expanding post. Ribs during breath work. The following impairments continue to be noted upon exam: gait deviations, incontinence (urge and stress), irregular bowel movements, pain with initial penetration, postural dysfunction (decr. Tx spine kyphosis, incr. Lx spine lordosis, ant. Pelvic tilt), decr. ROM during amb. (Decr. Trunk rotation), and decr. Strength likely based on subjective reports. Pt would continue to benefit from skilled PT to improve deficits listed above during all ADLs.  OBJECTIVE IMPAIRMENTS: Abnormal gait, decreased coordination, decreased endurance, decreased ROM, decreased strength, increased fascial restrictions, impaired flexibility, improper body mechanics, postural dysfunction, and pain.   ACTIVITY LIMITATIONS: carrying, lifting, bending,  squatting, continence, locomotion level, and caring for others  PARTICIPATION LIMITATIONS: interpersonal relationship, community activity, and occupation  PERSONAL FACTORS: 1-2 comorbidities: see above  are also affecting patient's functional outcome.   REHAB POTENTIAL: Good  CLINICAL DECISION MAKING: Stable/uncomplicated  EVALUATION COMPLEXITY: Low   GOALS: Goals reviewed with patient? Yes  SHORT TERM GOALS: Target date: for all STGs: 02/26/23  Pt will be IND in HEP to improve strength, posture, and flexibility. Baseline: no HEP Goal status: INITIAL  2.  Finish exam and write goals as indicated. Baseline: limited 2/2 time constraints Goal status: MET  3.  Complete FOTO and write goals as indicated. Baseline: not performed Goal status: INITIAL  4.  Pt will demo proper toileting posture to fully empty bladder and reduce strain during bowel movements. Baseline: unable to demo Goal status: INITIAL  5.  Assess running and write goal. Baseline: limited 2/2 time constraints Goal status: INITIAL   LONG TERM GOALS: Target date: for all LTGs: 03/26/23  Pt will demonstrate improvement of pelvic floor muscles (PFM) contraction and relaxation with coordination of breath to report no leakage and wearing <1 pad/day in last two weeks. Baseline: 1-2 pads/day Goal status: INITIAL  2.  Pt will be IND in performing and verbalizing birthing prep activities. Baseline: unable to perform Goal status: INITIAL  3.  Pt will demonstrate improved hip/LE strength and PFM relaxation techniques to decr. Pain during intercourse to improve QOL. Baseline: pain with initial penetration. Goal status: INITIAL  4.   Pt will be demonstrate improved TrA strength by demonstrating </=1 fingers width of diastasis recti which will improve leakage and pain. Baseline: 2-3 fingers width sup. And at umbilicus. Goal status: INITIAL   PLAN: TrA, cupping of LLE and paraspinals, assess B hip IR/ER ROM.  PT  FREQUENCY: 1x/week  PT DURATION: 8 weeks  PLANNED INTERVENTIONS: Therapeutic exercises, Therapeutic activity, Neuromuscular re-education, Balance training, Gait training, Patient/Family education, Self Care, Joint mobilization, Joint manipulation, Dry Needling, Spinal manipulation, Spinal mobilization, Taping, Biofeedback, Manual therapy, and Re-evaluation     Kensley Valladares L, PT 02/05/2023, 11:09 AM  Zerita Boers, PT,DPT 02/05/23 11:09 AM Phone: [phone] Fax: [phone]

## 2023-02-12 ENCOUNTER — Ambulatory Visit: Payer: Medicaid Other

## 2023-02-18 ENCOUNTER — Ambulatory Visit
Admission: EM | Admit: 2023-02-18 | Discharge: 2023-02-18 | Disposition: A | Discharge status: Home / Self Care | Payer: Medicaid Other | Attending: Emergency Medicine | Admitting: Emergency Medicine

## 2023-02-18 DIAGNOSIS — J302 Other seasonal allergic rhinitis: Secondary | ICD-10-CM

## 2023-02-18 DIAGNOSIS — J0100 Acute maxillary sinusitis, unspecified: Secondary | ICD-10-CM | POA: Diagnosis not present

## 2023-02-18 DIAGNOSIS — Z3A17 17 weeks gestation of pregnancy: Secondary | ICD-10-CM | POA: Diagnosis not present

## 2023-02-18 MED ORDER — AMOXICILLIN 875 MG PO TABS: 875.0000 mg | ORAL_TABLET | Freq: Two times a day (BID) | ORAL | 0 refills | Status: AC

## 2023-02-18 NOTE — Discharge Instructions (Addendum)
Take over-the-counter allergy medications as directed by your OB/GYN.  Take the amoxicillin as directed for sinus infection.  Follow up with your OB/GYN.

## 2023-02-18 NOTE — ED Provider Notes (Signed)
Renaldo Fiddler    CSN: [id number] Arrival date & time: 02/18/23  6269      History   Chief Complaint Chief Complaint  Patient presents with   Nasal Congestion    HPI Emily Flowers is a 27 y.o. female.  Patient is [redacted] weeks pregnant.  She presents with ear pain, sinus pressure, congestion, mild cough x >1 week.  Her nasal mucous is green and occasionally blood-tinged.  Treatment attempted with Tylenol and Robitussin; last Tylenol taken 2 days ago, last Robitussin taken last week.  She denies fever, sore throat, shortness of breath, chest pain, abdominal pain, or other symptoms.  Her medical history includes allergies.    The history is provided by the patient and medical records.    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   Subclinical hyperthyroidism 09/28/2022   Urge incontinence 10/04/2022    Patient Active Problem List   Diagnosis Date Noted   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   Supervision of other normal pregnancy, antepartum 12/04/2022    Past Surgical History:  Procedure Laterality Date   TONSILLECTOMY Bilateral 04/08/2020   Procedure: TONSILLECTOMY;  Surgeon: Linus Salmons, MD;  Location: [facility];  Service: ENT;  Laterality: Bilateral;   WISDOM TOOTH EXTRACTION      OB History     Gravida  2   Para  1   Term  1   Preterm      AB      Living  1      SAB      IAB      Ectopic      Multiple      Live Births  1            Home Medications    Prior to Admission medications   Medication Sig Start Date End Date Taking? Authorizing Provider  amoxicillin (AMOXIL) 875 MG tablet Take 1 tablet (875 mg total) by mouth 2 (two) times daily for 10 days. 02/18/23 02/28/23 Yes Mickie Bail, NP  acetaminophen (TYLENOL) 500 MG  tablet Take by mouth.    [provider]  cetirizine (ZYRTEC) 10 MG chewable tablet Chew 10 mg by mouth daily.    [provider]  fluticasone (FLONASE) 50 MCG/ACT nasal spray Place into both nostrils daily.    [provider]  Prenatal Vit-Fe Fumarate-FA (MULTIVITAMIN-PRENATAL) 27-0.8 MG TABS tablet Take 1 tablet by mouth daily at 12 noon.    [provider]    Family History Family History  Problem Relation Age of Onset   Diabetes Father    Cancer Maternal Aunt        breast   Diabetes Paternal Aunt    Diabetes Paternal Uncle     Social History Social History   Tobacco Use   Smoking status: Never    Passive exposure: Never   Smokeless tobacco: Never  Vaping Use   Vaping Use: Never used  Substance Use Topics   Alcohol use: No    Alcohol/week: 0.0 standard drinks of alcohol   Drug use: No     Allergies   Sulfa antibiotics   Review of Systems Review of Systems  Constitutional:  Negative for chills and fever.  HENT:  Positive for congestion, ear pain,  postnasal drip, rhinorrhea and sinus pressure. Negative for sore throat.   Respiratory:  Positive for cough. Negative for shortness of breath.   Cardiovascular:  Negative for chest pain and palpitations.  Gastrointestinal:  Negative for abdominal pain, nausea and vomiting.     Physical Exam Triage Vital Signs ED Triage Vitals  Enc Vitals Group     BP --      Pulse Rate 02/18/23 0932 85     Resp 02/18/23 0932 18     Temp 02/18/23 0932 98.1 F (36.7 C)     Temp src --      SpO2 02/18/23 0932 97 %     Weight --      Height --      Head Circumference --      Peak Flow --      Pain Score 02/18/23 0937 6     Pain Loc --      Pain Edu? --      Excl. in GC? --    No data found.  Updated Vital Signs BP 121/85   Pulse 85   Temp 98.1 F (36.7 C)   Resp 18   LMP 10/10/2022 (Exact Date)   SpO2 97%   Visual Acuity Right Eye Distance:   Left Eye Distance:   Bilateral  Distance:    Right Eye Near:   Left Eye Near:    Bilateral Near:     Physical Exam Vitals and nursing note reviewed.  Constitutional:      General: She is not in acute distress.    Appearance: She is well-developed. She is not ill-appearing.  HENT:     Right Ear: Tympanic membrane normal.     Left Ear: Tympanic membrane normal.     Nose: Congestion and rhinorrhea present.     Mouth/Throat:     Mouth: Mucous membranes are moist.     Pharynx: Oropharynx is clear.  Cardiovascular:     Rate and Rhythm: Normal rate and regular rhythm.     Heart sounds: Normal heart sounds.  Pulmonary:     Effort: Pulmonary effort is normal. No respiratory distress.     Breath sounds: Normal breath sounds.  Musculoskeletal:     Cervical back: Neck supple.  Skin:    General: Skin is warm and dry.  Neurological:     Mental Status: She is alert.  Psychiatric:        Mood and Affect: Mood normal.        Behavior: Behavior normal.      UC Treatments / Results  Labs (all labs ordered are listed, but only abnormal results are displayed) Labs Reviewed - No data to display  EKG   Radiology No results found.  Procedures Procedures (including critical care time)  Medications Ordered in UC Medications - No data to display  Initial Impression / Assessment and Plan / UC Course  I have reviewed the triage vital signs and the nursing notes.  Pertinent labs & imaging results that were available during my care of the patient were reviewed by me and considered in my medical decision making (see chart for details).    [redacted] weeks pregnant, seasonal allergies, acute sinusitis.  Patient has been symptomatic for more than a week.  Treating with amoxicillin.  Instructed patient to follow-up with her OB/GYN if her symptoms are not improving.  Education provided on allergic rhinitis and sinusitis.  She agrees to plan of care.  Final Clinical Impressions(s) / UC Diagnoses  Final diagnoses:  [redacted] weeks  gestation of pregnancy  Seasonal allergies  Acute non-recurrent maxillary sinusitis     Discharge Instructions      Take over-the-counter allergy medications as directed by your OB/GYN.  Take the amoxicillin as directed for sinus infection.  Follow up with your OB/GYN.       ED Prescriptions     Medication Sig Dispense Auth. Provider   amoxicillin (AMOXIL) 875 MG tablet Take 1 tablet (875 mg total) by mouth 2 (two) times daily for 10 days. 20 tablet Mickie Bail, NP      PDMP not reviewed this encounter.   Mickie Bail, NP 02/18/23 1005

## 2023-02-18 NOTE — ED Triage Notes (Signed)
Patient to Urgent Care with complaints of sinus pain and congestion/ bilateral ear pressure/ head pressure that started over one week ago. Denies any known fevers.   Pt 17d5d pregnant, EDD 07/24/2023. Has taken two doses of robitussin/ tylenol.

## 2023-02-19 ENCOUNTER — Ambulatory Visit: Payer: Medicaid Other

## 2023-02-20 ENCOUNTER — Ambulatory Visit (INDEPENDENT_AMBULATORY_CARE_PROVIDER_SITE_OTHER): Payer: Medicaid Other | Admitting: Family Medicine

## 2023-02-20 VITALS — BP 109/74 | HR 85 | Wt 138.0 lb

## 2023-02-20 DIAGNOSIS — Z148 Genetic carrier of other disease: Secondary | ICD-10-CM

## 2023-02-20 DIAGNOSIS — Z3480 Encounter for supervision of other normal pregnancy, unspecified trimester: Secondary | ICD-10-CM

## 2023-02-20 NOTE — Progress Notes (Signed)
ROB:  Right sided cramping  Wants to discuss Being cold and vitamin D and anemia

## 2023-02-20 NOTE — Progress Notes (Signed)
    PRENATAL VISIT NOTE  Subjective:  Emily Flowers is a 27 y.o. G2P1001 at [redacted]w[redacted]d being seen today for ongoing prenatal care.  She is currently monitored for the following issues for this low-risk pregnancy and has Supervision of other normal pregnancy, antepartum and Carrier of spinal muscular atrophy on their problem list.  Patient reports no complaints.  Contractions: Not present. Vag. Bleeding: None.  Movement: Present. Denies leaking of fluid.   The following portions of the patient's history were reviewed and updated as appropriate: allergies, current medications, past family history, past medical history, past social history, past surgical history and problem list.   Objective:   Vitals:   02/20/23 0816  BP: 109/74  Pulse: 85  Weight: 138 lb (62.6 kg)    Fetal Status: Fetal Heart Rate (bpm): 145   Movement: Present     General:  Alert, oriented and cooperative. Patient is in no acute distress.  Skin: Skin is warm and dry. No rash noted.   Cardiovascular: Normal heart rate noted  Respiratory: Normal respiratory effort, no problems with respiration noted  Abdomen: Soft, gravid, appropriate for gestational age.  Pain/Pressure: Absent     Pelvic: Cervical exam deferred        Extremities: Normal range of motion.  Edema: None  Mental Status: Normal mood and affect. Normal behavior. Normal judgment and thought content.   Assessment and Plan:  Pregnancy: G2P1001 at [redacted]w[redacted]d 1. Supervision of other normal pregnancy, antepartum Has some pelvic floor dysfunction in pelvic PT--has diastasis, discussed Has anatomy u/s scheduled.  2. Carrier of spinal muscular atrophy Partner testing today  Preterm labor symptoms and general obstetric precautions including but not limited to vaginal bleeding, contractions, leaking of fluid and fetal movement were reviewed in detail with the patient. Please refer to After Visit Summary for other counseling recommendations.   Return in 4 weeks (on  03/20/2023).  Future Appointments  Date Time Provider Department Center  03/05/2023 11:00 AM Sherren Kerns, PT [facility] None  03/06/2023 10:15 AM [facility] NURSE [facility]  03/06/2023 10:30 AM [facility] US[facility]  03/12/2023 11:00 AM Sherren Kerns, PT [facility] None  03/19/2023 11:00 AM Sherren Kerns, PT [facility] None  03/20/2023  8:55 AM Reva Bores, MD [facility]  04/09/2023 11:00 AM Sherren Kerns, PT [facility] None  04/16/2023 11:00 AM Sherren Kerns, PT [facility] None  04/30/2023  9:15 AM [facility] None  04/30/2023  9:30 AM Creig Hines, MD [facility] None    Reva Bores, MD

## 2023-02-21 ENCOUNTER — Encounter: Payer: Medicaid Other | Admitting: Obstetrics & Gynecology

## 2023-02-27 ENCOUNTER — Ambulatory Visit: Payer: Medicaid Other

## 2023-02-27 ENCOUNTER — Other Ambulatory Visit: Payer: Medicaid Other

## 2023-03-05 ENCOUNTER — Ambulatory Visit: Payer: Medicaid Other

## 2023-03-06 ENCOUNTER — Telehealth: Payer: Self-pay

## 2023-03-06 ENCOUNTER — Ambulatory Visit: Payer: Medicaid Other

## 2023-03-06 NOTE — Telephone Encounter (Signed)
Called pt to notify of mfm appointment on 03/14/23 at 9:30am. Left vm for pt to call back with any questions.

## 2023-03-11 ENCOUNTER — Ambulatory Visit: Payer: Medicaid Other | Attending: Urology | Admitting: Physical Therapy

## 2023-03-11 DIAGNOSIS — R2689 Other abnormalities of gait and mobility: Secondary | ICD-10-CM | POA: Diagnosis present

## 2023-03-11 DIAGNOSIS — N3946 Mixed incontinence: Secondary | ICD-10-CM

## 2023-03-11 DIAGNOSIS — M6281 Muscle weakness (generalized): Secondary | ICD-10-CM

## 2023-03-11 NOTE — Patient Instructions (Addendum)
This week only :   Lengthen Back rib by R  shoulder   ( winging)    Lie on L  side , pillow between knees and under head  Pull  R arm overhead over mattress, grab the edge of mattress,pull it upward, drawing elbow away from ears  Breathing 10 reps  Brushing arm with 3/4 turn onto pillow behind back  Lying on L  side ,Pillow/ Block between knees     dragging top forearm across ribs below breast rotating 3/4 turn,  rotating  _R_ only this week ,  relax onto the pillow behind the back  and then back to other palm , maintain top palm on body whole top and not lift shoulder  __   Body mechanics to keep up life long   Avoid straining pelvic floor, abdominal muscles , spine  Use log rolling technique instead of getting out of bed with your neck or the sit-up     Log rolling into and out of bed   Log rolling into and out of bed If getting out of bed on R side, Bent knees, scoot hips/ shoulder to L  Raise R arm completely overhead, rolling onto armpit  Then lower bent knees to bed to get into complete side lying position  Then drop legs off bed, and push up onto R elbow/forearm, and use L hand to push onto the bed  __   Proper body mechanics with getting out of a chair to decrease strain  on back &pelvic floor   Avoid holding your breath when Getting out of the chair:  Scoot to front part of chair chair Heels behind knees, feet are hip width apart, nose over toes  Inhale like you are smelling roses Exhale to stand   __  Sitting with feet on ground, four points of contact Catch yourself crossing ankles and thighs  __  Minisquat:  ( with bending tasks) lifting daughter)  Scoot buttocks back slight, hinge like you are looking at your reflection on a pond  Knees behind toes,  Inhale to "smell flowers"  Exhale on the rise "like rocket"  Do not lock knees, have more weight across ballmounds of feet, toes relaxed and spread them, not grip them   10 reps x 3 x day    __ __  Holding daughter with weight on both legs and feet , unlocked knees

## 2023-03-11 NOTE — Therapy (Signed)
OUTPATIENT PHYSICAL THERAPY FEMALE PELVIC TREATMENT   Patient Name: Emily Flowers MRN: [id number] DOB:1996-08-03, 27 y.o., female Today's Date: 03/11/2023  END OF SESSION:  PT End of Session - 03/11/23 0805     Visit Number 3    Number of Visits 9    Date for PT Re-Evaluation 03/30/23    Authorization Type [employer] and [employer]    Progress Note Due on Visit 10    PT Start Time 0802    PT Stop Time 0845    PT Time Calculation (min) 43 min    Activity Tolerance Patient tolerated treatment well    Behavior During Therapy [facility] for tasks assessed/performed             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 Andi Hence per pt  REFERRING PROVIDER: Dr. Irineo Axon  REFERRING DIAG: Urge incontinence  THERAPY DIAG:  Mixed incontinence  Muscle weakness (generalized)  Other abnormalities of gait and mobility  Rationale for Evaluation and Treatment: Rehabilitation  ONSET DATE: 10/15/22 referral date  SUBJECTIVE:                                                                                                                                                                                           SUBJECTIVE STATEMENT: Pt is 21 weeks with her 2nd pregnancy.  Pt had minor stitches with her home  birth pregnancy with her 1st child.  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 Bowel movements occur every 2-3 days. Pt strains 20% of the time.  Pt says she needs to increase her water intake. Pt was a Therapist, music. Pt is walking mainly. Pt would like to begin to a moderate weight lifting. Pt realized HIIT classes was too much for her. Pt would like get back to swimming.   Pt is feeling pubic pain when she has been doing a lot of bending. Daughter is  17 months and 24 lbs. Pt holds her dtr on her R hip .  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   PAIN:  Are you having pain? No 02/05/23 NPRS scale: 0/10 Pain location:  none  Pain type: none Pain description:  none    Aggravating factors: n/a Relieving factors: n/a  PRECAUTIONS: None  WEIGHT BEARING RESTRICTIONS: No  FALLS:  Has patient fallen in last 6 months? 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  PLOF: Independent  PATIENT GOALS: I would like a stronger pelvic floor and stopped leakage and regular bowel movements and be able to exercise without discomfort.  PERTINENT HISTORY:  Heart murmur and chart states hyperthyroidism but pt states that is incorrect. Sexual abuse: No  BOWEL MOVEMENT: Pain with bowel movement: Yes Type of bowel movement:Frequency every day to every 2-3 days. Fully empty rectum: No not always Leakage: No Pads: No Fiber supplement: No  URINATION: Pain with urination: No Fully empty bladder: Yes:   Stream:  it varies, she feels like the stream is weak at times. Urgency: Yes: with leakage Frequency: voids 1-2 times every 2-4 hours. Leakage: Urge to void, Walking to the bathroom, and  Sneezing Pads: Yes: 1-2 pantyliners/day  INTERCOURSE: Pain with intercourse: Initial Penetration Ability to have vaginal penetration:  Yes:   Climax: no Marinoff Scale: 0/3  PREGNANCY: Vaginal deliveries: one Tearing Yes: small tear C-section deliveries: none Currently pregnant Yes: 15 weeks  PROLAPSE: None   OBJECTIVE:   DIAGNOSTIC FINDINGS:  N/a  PATIENT SURVEYS:   COGNITION: Overall cognitive status: Within functional limits for tasks assessed     SENSATION: Appears intatct  GAIT: Distance walked: 38' Assistive device utilized: None Level of assistance: Complete Independence Comments: decr. Trunk rotation  POSTURE: increased lumbar lordosis, decreased thoracic kyphosis, and anterior pelvic tilt  PELVIC ALIGNMENT: ant. Pelvic tilt, no obliquity noted  LUMBARAROM/PROM:   A/PROM A/PROM  eval  Flexion WFL  Extension WFL, 3/10 LBP  Right lateral flexion WFL with L low back pulling sensation  Left lateral flexion WFL  Right rotation WFL  Left rotation WFL   (Blank rows = not tested)  LOWER EXTREMITY ROM:  Active ROM Right eval Left eval  Hip flexion [facility]  Hip extension    Hip abduction    Hip adduction    Hip internal rotation    Hip external rotation    Knee flexion [facility]  Knee extension [facility]  Ankle dorsiflexion [facility]  Ankle plantarflexion    Ankle inversion    Ankle eversion     (Blank rows = not tested)  LOWER EXTREMITY MMT:  MMT Right eval Left eval  Hip flexion 4/5 4/5  Hip extension    Hip abduction 2/5 2/5  Hip adduction 3/5 3/5  Hip internal rotation    Hip external rotation    Knee flexion 4/5 4/5  Knee extension 5/5 5/5  Ankle dorsiflexion 5/5 5/5  Ankle plantarflexion    Ankle inversion    Ankle eversion      [facility] PT Assessment - 03/11/23 0837       Squat   Comments minimal knee flexion, poor form      Strength   Overall  Strength Comments R hip flexion 4/5, L 5/5 , knee flex/ext  B 5/5      Palpation    Spinal mobility L iliac crest, R shoulder higher,      Ambulation/Gait   Gait Comments 1.3 m/s, excessive sway to L , IR knees  ( post Tx: 1.28 m/s , reciprocal gait)               [facility] Adult PT Treatment/Exercise - 03/11/23 0837       Therapeutic Activites    Therapeutic Activities Other Therapeutic Activities;Lifting;ADL's    Lifting proper lifting technique with toddler    Other Therapeutic Activities explained body mechanics, sitting posture, sit to stand, bending to minimize pain      Neuro Re-ed    Neuro Re-ed Details  cued for body mechanics see pt instructions      Modalities   Modalities Moist Heat      Moist Heat Therapy   Number Minutes Moist Heat 3 Minutes    Moist Heat Location Other (comment)   thoracic     Manual Therapy   Manual therapy comments STM/MWM at thoracic segments to realign spine              PATIENT EDUCATION:  Education details: PT educated pt on further exam findings and IAP management to reduce leakage, pain, and DR. Person educated: Patient and Spouse Education method: Explanation, Demonstration, Verbal cues, and Handouts Education comprehension: verbalized understanding, returned demonstration, and needs further education  HOME EXERCISE PROGRAM: Medbridge: [id number].  ASSESSMENT:  CLINICAL IMPRESSION: Pt showed more levelled pelvic girdle and shoulder, improve gait and speed after manual Tx which was focused on realignment of thoracic spine. Treatment was modified with caution for pregnancy, in sidelying position.   Plan to add deep core training and address IR hips/ knees at next session. Excessive cues for body mechanics for pelvic stability in minisquats, bending, lifting which are tasks when she experiences pubic pain. Pt reported less pubic pain with these modifications. Pt was explained about anterior tilt of pelvis for bowel movements and proper sitting posture.   Pt would continue to benefit from skilled PT to improve  deficits listed above during all ADLs.  OBJECTIVE IMPAIRMENTS: Abnormal gait, decreased coordination, decreased endurance, decreased ROM, decreased strength, increased fascial restrictions, impaired flexibility, improper body mechanics, postural dysfunction, and pain.   ACTIVITY LIMITATIONS: carrying, lifting, bending, squatting, continence, locomotion level, and caring for others  PARTICIPATION LIMITATIONS: interpersonal relationship, community activity, and occupation  PERSONAL FACTORS: 1-2 comorbidities: see above  are also affecting patient's functional outcome.   REHAB POTENTIAL: Good  CLINICAL DECISION MAKING: Stable/uncomplicated  EVALUATION COMPLEXITY: Low   GOALS: Goals reviewed with patient? Yes  SHORT TERM GOALS: Target date: for all STGs: 02/26/23  Pt will be IND in HEP to improve strength, posture, and flexibility. Baseline: no HEP Goal status: INITIAL  2.  Finish exam and write goals as indicated. Baseline: limited 2/2 time constraints Goal status: MET  3.  Complete FOTO and write goals as indicated. Baseline: not performed Goal status: INITIAL  4.  Pt will demo proper toileting posture to fully empty bladder and reduce strain during bowel movements. Baseline: unable to demo Goal status: INITIAL  5.  Assess running and write goal. Baseline: limited 2/2 time constraints Goal status: INITIAL   LONG TERM GOALS: Target date: for all LTGs: 03/26/23  Pt will demonstrate improvement of pelvic floor muscles (PFM) contraction and relaxation with coordination of breath to report no  leakage and wearing <1 pad/day in last two weeks. Baseline: 1-2 pads/day Goal status: INITIAL  2.  Pt will be IND in performing and verbalizing birthing prep activities. Baseline: unable to perform Goal status: INITIAL  3.  Pt will demonstrate improved hip/LE strength and PFM relaxation techniques to decr. Pain during intercourse to improve QOL. Baseline: pain with initial  penetration. Goal status: INITIAL  4.   Pt will be demonstrate improved TrA strength by demonstrating </=1 fingers width of diastasis recti which will improve leakage and pain. Baseline: 2-3 fingers width sup. And at umbilicus. Goal status: INITIAL   PLAN: See clinical impression  PT FREQUENCY: 1x/week  PT DURATION: 8 weeks  PLANNED INTERVENTIONS: Therapeutic exercises, Therapeutic activity, Neuromuscular re-education, Balance training, Gait training, Patient/Family education, Self Care, Joint mobilization, Joint manipulation, Dry Needling, Spinal manipulation, Spinal mobilization, Taping, Biofeedback, Manual therapy, and Re-evaluation     Mariane Masters, PT 03/11/2023, 8:06 AM

## 2023-03-12 ENCOUNTER — Ambulatory Visit: Payer: Medicaid Other

## 2023-03-14 ENCOUNTER — Ambulatory Visit: Payer: Medicaid Other | Admitting: *Deleted

## 2023-03-14 ENCOUNTER — Ambulatory Visit: Payer: Medicaid Other | Attending: Family Medicine

## 2023-03-14 ENCOUNTER — Encounter: Payer: Self-pay | Admitting: *Deleted

## 2023-03-14 VITALS — BP 114/57 | HR 68

## 2023-03-14 DIAGNOSIS — Z3480 Encounter for supervision of other normal pregnancy, unspecified trimester: Secondary | ICD-10-CM | POA: Diagnosis not present

## 2023-03-14 DIAGNOSIS — Z3689 Encounter for other specified antenatal screening: Secondary | ICD-10-CM

## 2023-03-14 DIAGNOSIS — Z3A21 21 weeks gestation of pregnancy: Secondary | ICD-10-CM | POA: Insufficient documentation

## 2023-03-14 DIAGNOSIS — Z363 Encounter for antenatal screening for malformations: Secondary | ICD-10-CM | POA: Insufficient documentation

## 2023-03-14 DIAGNOSIS — Z3482 Encounter for supervision of other normal pregnancy, second trimester: Secondary | ICD-10-CM | POA: Insufficient documentation

## 2023-03-19 ENCOUNTER — Ambulatory Visit: Payer: Medicaid Other

## 2023-03-20 ENCOUNTER — Ambulatory Visit (INDEPENDENT_AMBULATORY_CARE_PROVIDER_SITE_OTHER): Payer: Medicaid Other | Admitting: Family Medicine

## 2023-03-20 VITALS — BP 113/66 | HR 87 | Wt 140.0 lb

## 2023-03-20 DIAGNOSIS — Z148 Genetic carrier of other disease: Secondary | ICD-10-CM

## 2023-03-20 DIAGNOSIS — Z3480 Encounter for supervision of other normal pregnancy, unspecified trimester: Secondary | ICD-10-CM

## 2023-03-20 NOTE — Progress Notes (Signed)
ROB: Pelvic floor therapy helping   Pt stating that she is out of breath and feels like her anxiety is picking up and stress

## 2023-03-20 NOTE — Progress Notes (Signed)
   PRENATAL VISIT NOTE  Subjective:  Emily Flowers is a 27 y.o. G2P1001 at [redacted]w[redacted]d being seen today for ongoing prenatal care.  She is currently monitored for the following issues for this low-risk pregnancy and has Supervision of other normal pregnancy, antepartum and Carrier of spinal muscular atrophy on their problem list.  Patient reports  occasional SOB with exercise. Has h/o exercise induced asthma.  Feels heart racing.  Contractions: Not present. Vag. Bleeding: None.  Movement: Present. Denies leaking of fluid.   The following portions of the patient's history were reviewed and updated as appropriate: allergies, current medications, past family history, past medical history, past social history, past surgical history and problem list.   Objective:   Vitals:   03/20/23 0854  BP: 113/66  Pulse: 87  Weight: 140 lb (63.5 kg)    Fetal Status: Fetal Heart Rate (bpm): 147 Fundal Height: 14 cm Movement: Present     General:  Alert, oriented and cooperative. Patient is in no acute distress.  Skin: Skin is warm and dry. No rash noted.   Cardiovascular: Normal heart rate noted  Respiratory: Normal respiratory effort, no problems with respiration noted  Abdomen: Soft, gravid, appropriate for gestational age.  Pain/Pressure: Absent     Pelvic: Cervical exam deferred        Extremities: Normal range of motion.  Edema: None  Mental Status: Normal mood and affect. Normal behavior. Normal judgment and thought content.   Assessment and Plan:  Pregnancy: G2P1001 at [redacted]w[redacted]d 1. Supervision of other normal pregnancy, antepartum Continue routine prenatal care.  2. 22 weeks  3. SOB ? SVT vs. Exercise induced asthma. To check pulse and let me know if elevated, will refer to cardio-OB. Consider inhaler if needed.  Preterm labor symptoms and general obstetric precautions including but not limited to vaginal bleeding, contractions, leaking of fluid and fetal movement were reviewed in detail with  the patient. Please refer to After Visit Summary for other counseling recommendations.   Return in 4 weeks (on 04/17/2023).  Future Appointments  Date Time Provider Department Center  03/21/2023  8:00 AM Mariane Masters, PT [facility] None  03/27/2023  8:00 AM Mariane Masters, PT [facility] None  04/04/2023  8:00 AM Dayle Points, [place], PT [facility] None  04/11/2023  8:00 AM Mariane Masters, PT [facility] None  04/17/2023  8:35 AM Reva Bores, MD [facility]  04/17/2023  8:45 AM [facility]  04/18/2023  8:00 AM Mariane Masters, PT [facility] None  04/25/2023  8:00 AM Dayle Points, Shin-Yiing, PT [facility] None  04/30/2023  9:15 AM [facility] None  04/30/2023  9:30 AM Creig Hines, MD [facility] None  05/08/2023  8:00 AM Mariane Masters, PT [facility] None  05/15/2023  8:00 AM Mariane Masters, PT [facility] None  05/22/2023  8:00 AM Mariane Masters, PT [facility] None  05/29/2023  8:00 AM Dayle Points, Shin-Yiing, PT [facility] None  06/05/2023  8:00 AM Mariane Masters, PT [facility] None  06/12/2023  8:00 AM Mariane Masters, PT [facility] None  06/19/2023  8:00 AM Mariane Masters, PT [facility] None    Reva Bores, MD

## 2023-03-21 ENCOUNTER — Ambulatory Visit: Payer: Medicaid Other | Admitting: Physical Therapy

## 2023-03-23 ENCOUNTER — Inpatient Hospital Stay (HOSPITAL_COMMUNITY)
Admission: AD | Admit: 2023-03-23 | Discharge: 2023-03-24 | Disposition: A | Discharge status: Home / Self Care | Payer: Medicaid Other | Source: Home / Self Care | Attending: Family Medicine | Admitting: Family Medicine

## 2023-03-23 DIAGNOSIS — Z3A22 22 weeks gestation of pregnancy: Secondary | ICD-10-CM

## 2023-03-23 DIAGNOSIS — T148XXA Other injury of unspecified body region, initial encounter: Secondary | ICD-10-CM | POA: Insufficient documentation

## 2023-03-23 DIAGNOSIS — W57XXXA Bitten or stung by nonvenomous insect and other nonvenomous arthropods, initial encounter: Secondary | ICD-10-CM | POA: Insufficient documentation

## 2023-03-23 DIAGNOSIS — S90562A Insect bite (nonvenomous), left ankle, initial encounter: Secondary | ICD-10-CM

## 2023-03-23 DIAGNOSIS — Y939 Activity, unspecified: Secondary | ICD-10-CM | POA: Insufficient documentation

## 2023-03-23 DIAGNOSIS — O26892 Other specified pregnancy related conditions, second trimester: Secondary | ICD-10-CM | POA: Insufficient documentation

## 2023-03-23 NOTE — MAU Note (Signed)
.  Emily Flowers is a 27 y.o. at [redacted]w[redacted]d here in MAU reporting: she was at a wedding outside and got bit by some kind of insect on her left foot . She noticed that the area is swollen and it hurts. Pain radiates up part of her leg. Stated she has not fle baby move since it happen until she got in the car and then it started to move again.  LMP:  Onset of complaint: 5:30p Pain score: 7 Vitals:   03/23/23 2351  BP: (!) 114/54  Pulse: 80  Resp: 18  Temp: 98 F (36.7 C)     FHT:142 Lab orders placed from triage:

## 2023-03-24 DIAGNOSIS — W57XXXA Bitten or stung by nonvenomous insect and other nonvenomous arthropods, initial encounter: Secondary | ICD-10-CM | POA: Diagnosis not present

## 2023-03-24 DIAGNOSIS — Z3A22 22 weeks gestation of pregnancy: Secondary | ICD-10-CM

## 2023-03-24 DIAGNOSIS — S90562A Insect bite (nonvenomous), left ankle, initial encounter: Secondary | ICD-10-CM | POA: Diagnosis not present

## 2023-03-24 DIAGNOSIS — Y939 Activity, unspecified: Secondary | ICD-10-CM | POA: Diagnosis not present

## 2023-03-24 DIAGNOSIS — T148XXA Other injury of unspecified body region, initial encounter: Secondary | ICD-10-CM | POA: Diagnosis not present

## 2023-03-24 DIAGNOSIS — O26892 Other specified pregnancy related conditions, second trimester: Secondary | ICD-10-CM | POA: Diagnosis present

## 2023-03-24 MED ORDER — DIPHENHYDRAMINE HCL 25 MG PO CAPS
25.0000 mg | ORAL_CAPSULE | Freq: Once | ORAL | Status: AC
  Administered 2023-03-24: 25 mg via ORAL
  Filled 2023-03-24: qty 1

## 2023-03-24 MED ORDER — ACETAMINOPHEN 325 MG PO TABS
650.0000 mg | ORAL_TABLET | Freq: Once | ORAL | Status: AC
  Administered 2023-03-24: 650 mg via ORAL
  Filled 2023-03-24: qty 2

## 2023-03-24 NOTE — MAU Provider Note (Signed)
History     CSN: [id number]  Arrival date and time: 03/23/23 2318   Event Date/Time   First Provider Initiated Contact with Patient 03/24/23 0019      Chief Complaint  Patient presents with   Insect Bite    Emily Flowers is a 27 y.o. G2P1001 at [redacted]w[redacted]d who receives care at [facility].  She presents today for DFM.  Patient reports movement is occurring now. Patient also concerned with what she thinks is a bug bite that she noted around 1730.  She reports it is located on her ankle.  She endorses itching and reports it burns when she scratches it. She reports the pain is a "throbbing pain that radiates to my shin."  She reports she has not taken anything for the pain. She rates the pain a 7/10.     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   Subclinical hyperthyroidism 09/28/2022   Urge incontinence 10/04/2022    Past Surgical History:  Procedure Laterality Date   TONSILLECTOMY Bilateral 04/08/2020   Procedure: TONSILLECTOMY;  Surgeon: Linus Salmons, MD;  Location[facility];  Service: ENT;  Laterality: Bilateral;   WISDOM TOOTH EXTRACTION      Family History  Problem Relation Age of Onset   Diabetes Father    Cancer Maternal Aunt        breast   Diabetes Paternal Aunt    Diabetes Paternal Uncle     Social History   Tobacco Use   Smoking status: Never    Passive exposure: Never   Smokeless tobacco: Never  Vaping Use   Vaping status: Never Used  Substance Use Topics   Alcohol use: No    Alcohol/week: 0.0 standard drinks of alcohol   Drug use: No    Allergies:  Allergies  Allergen  Reactions   Sulfa Antibiotics     Unknown childhood reaction    Medications Prior to Admission  Medication Sig Dispense Refill Last Dose   acetaminophen (TYLENOL) 500 MG tablet Take by mouth.      cetirizine (ZYRTEC) 10 MG chewable tablet Chew 10 mg by mouth daily.      fluticasone (FLONASE) 50 MCG/ACT nasal spray Place into both nostrils daily. (Patient not taking: Reported on 03/14/2023)      Prenatal Vit-Fe Fumarate-FA (MULTIVITAMIN-PRENATAL) 27-0.8 MG TABS tablet Take 1 tablet by mouth daily at 12 noon.       Review of Systems  Constitutional:  Negative for chills and fever.  Gastrointestinal:  Negative for abdominal pain, constipation, diarrhea, nausea and vomiting.  Genitourinary:  Negative for difficulty urinating, dysuria, vaginal bleeding and vaginal discharge.   Physical Exam   Blood pressure (!) 114/54, pulse 80, temperature 98 F (36.7 C), resp. rate 18, height 5\' 6"  (1.676 m), weight 64 kg, last menstrual period 10/10/2022.  Physical  Exam Constitutional:      Appearance: Normal appearance.  HENT:     Head: Normocephalic and atraumatic.  Eyes:     Conjunctiva/sclera: Conjunctivae normal.  Pulmonary:     Effort: Pulmonary effort is normal. No respiratory distress.  Abdominal:     Comments: Gravid, Appears AGA  Musculoskeletal:        General: Normal range of motion.  Skin:    General: Skin is warm and dry.     Findings: Rash present. Rash is papular.     Comments: Bites noted on ankles bilaterally  Neurological:     Mental Status: She is alert.     MAU Course  Procedures  MDM Exam Antihistamine Pain Medication Assessment and Plan  27 year old, G2P1001  SIUP at 22.4 weeks Insect Bite DFM-Resolved  -Reviewed POC with patient. -Exam performed and findings discussed.  -Reviewed usage of topical and oral medications for bites. -Patient offered and accepts pain medication. -Given tylenol and benadryl. -Precautions reviewed. -Pregnancy safe  medication list provided in AVS.  -Discharged to home in stable condition.   Cherre Robins 03/24/2023, 12:19 AM

## 2023-03-24 NOTE — Discharge Instructions (Signed)

## 2023-03-27 ENCOUNTER — Ambulatory Visit: Payer: Medicaid Other | Admitting: Physical Therapy

## 2023-03-27 DIAGNOSIS — N3946 Mixed incontinence: Secondary | ICD-10-CM

## 2023-03-27 DIAGNOSIS — R2689 Other abnormalities of gait and mobility: Secondary | ICD-10-CM

## 2023-03-27 DIAGNOSIS — M6281 Muscle weakness (generalized): Secondary | ICD-10-CM

## 2023-03-27 NOTE — Therapy (Signed)
OUTPATIENT PHYSICAL THERAPY FEMALE PELVIC TREATMENT   Patient Name: Emily Flowers MRN: [id number] DOB:1996-01-12, 27 y.o., female Today's Date: 03/27/2023  END OF SESSION:  PT End of Session - 03/27/23 0809     Visit Number 4    Number of Visits 9    Date for PT Re-Evaluation 03/30/23    Authorization Type [employer] and [employer]    Progress Note Due on Visit 10    PT Start Time 0805    PT Stop Time 0845    PT Time Calculation (min) 40 min    Activity Tolerance Patient tolerated treatment well    Behavior During Therapy 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 Andi Hence per pt  REFERRING PROVIDER: Dr. Irineo Axon  REFERRING DIAG: Urge incontinence  THERAPY DIAG:  Mixed incontinence  Muscle weakness (generalized)  Other abnormalities of gait and mobility  Rationale for Evaluation and Treatment: Rehabilitation  ONSET DATE: 10/15/22 referral date  SUBJECTIVE:      SUBJECTIVE STATEMENT TODAY:  Pt reports 50% improvement with pain. Pt is paying attention not locking her knees.                                                                                                                                                                                       SUBJECTIVE STATEMENT on 01/29/23 : Pt is 21 weeks with her 2nd pregnancy.  Pt had minor stitches with her home birth pregnancy with her 1st child.  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 Bowel movements occur every 2-3 days. Pt strains 20% of the time.  Pt says she needs to increase her water intake. Pt was a Therapist, music. Pt is walking mainly. Pt would like to begin to a moderate weight lifting. Pt realized HIIT classes was too much for her. Pt would like get  back to swimming.   Pt is feeling pubic pain when she has been doing a lot of bending. Daughter is 17 months and 24 lbs. Pt holds her dtr on her R hip .  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   PAIN:  Are you having pain? No 02/05/23 NPRS scale: 0/10 Pain location:  none  Pain type: none Pain description:  none    Aggravating factors: n/a Relieving factors: n/a  PRECAUTIONS: None  WEIGHT BEARING RESTRICTIONS: No  FALLS:  Has patient fallen in last 6 months? 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  PLOF: Independent  PATIENT GOALS: I would like a stronger pelvic floor and stopped leakage and regular bowel movements and be able to exercise without discomfort.  PERTINENT HISTORY:  Heart murmur and chart states hyperthyroidism but pt states that is incorrect. Sexual abuse: No  BOWEL MOVEMENT: Pain with bowel movement: Yes Type of bowel movement:Frequency every day to every 2-3 days. Fully empty rectum: No not always Leakage: No Pads: No Fiber supplement: No  URINATION: Pain with urination: No Fully empty bladder: Yes:   Stream:  it varies, she feels like the stream is weak at  times. Urgency: Yes: with leakage Frequency: voids 1-2 times every 2-4 hours. Leakage: Urge to void, Walking to the bathroom, and Sneezing Pads: Yes: 1-2 pantyliners/day  INTERCOURSE: Pain with intercourse: Initial Penetration Ability to have vaginal penetration:  Yes:   Climax: no Marinoff Scale: 0/3  PREGNANCY: Vaginal deliveries: one Tearing Yes: small tear C-section deliveries: none Currently pregnant Yes: 15 weeks  PROLAPSE: None   OBJECTIVE:   DIAGNOSTIC FINDINGS:  N/a  PATIENT SURVEYS:   COGNITION: Overall cognitive status: Within functional limits for tasks assessed     SENSATION: Appears intatct  GAIT: Distance walked: 31' Assistive device utilized: None Level of assistance: Complete Independence Comments: decr. Trunk rotation  POSTURE: increased lumbar lordosis, decreased thoracic kyphosis, and anterior pelvic tilt  PELVIC ALIGNMENT: ant. Pelvic tilt, no obliquity noted  LUMBARAROM/PROM:   A/PROM A/PROM  eval  Flexion WFL  Extension WFL, 3/10 LBP  Right lateral flexion WFL with L low back pulling sensation  Left lateral flexion WFL  Right rotation WFL  Left rotation WFL   (Blank rows = not tested)  LOWER EXTREMITY ROM:  Active ROM Right eval Left eval  Hip flexion [facility]  Hip extension    Hip abduction    Hip adduction    Hip internal rotation    Hip external rotation    Knee flexion [facility]  Knee extension [facility]  Ankle dorsiflexion [facility]  Ankle plantarflexion    Ankle inversion    Ankle eversion     (Blank rows = not tested)  LOWER EXTREMITY MMT:  MMT Right eval Left eval  Hip flexion 4/5 4/5  Hip extension    Hip abduction 2/5 2/5  Hip adduction 3/5 3/5  Hip internal rotation    Hip external rotation    Knee flexion 4/5 4/5  Knee extension 5/5 5/5  Ankle dorsiflexion 5/5 5/5  Ankle plantarflexion    Ankle inversion    Ankle eversion         [facility] PT Assessment - 03/27/23 0813  Observation/Other  Assessments   Observations IR knees, hallux vaglus B      Strength   Overall Strength Comments R hip flexion 4/5, L 5/5      Palpation   Spinal mobility levelled pelvis, shoulder levelled    Palpation comment tightness of planta fascia, between interreous ray I-II, II-III, III-IV  L,             [facility] Adult PT Treatment/Exercise - 03/27/23 1235       Therapeutic Activites    Other Therapeutic Activities explained ER of hips , knee , DF/ EV to propmote pelvic stability, explained contrainidcations to pregnancy with pressure points by medial ankle and upper trap which will be avoided with manual Tx      Neuro Re-ed    Neuro Re-ed Details  cued for gait to mobilize feet ,      Manual Therapy   Manual therapy comments STM/MWM distraction, PA/ AP mob to promote toe abduciton, mobilize hypomoibilize midfoot joints              PATIENT EDUCATION:  Education details: PT educated pt on further exam findings and IAP management to reduce leakage, pain, and DR. Person educated: Patient and Spouse Education method: Explanation, Demonstration, Verbal cues, and Handouts Education comprehension: verbalized understanding, returned demonstration, and needs further education  HOME EXERCISE PROGRAM: Medbridge: [id number].  ASSESSMENT:  CLINICAL IMPRESSION: Pt reports 50% improvement with pain this week.  Regional interdependence approach is helpful for pt as pt demo'd hallux valgus, IR of knee which contribute to weaker pelvic stability.  Manual Tx was applied to promote more use of transverse arches which provided stronger push off and gait mechanics which help strengthen posterior chain, LKC , pelvic stability. Cued for new mechanics for gait and sit to stand and was explained relationship pf feet to pelvis. 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   Pt would continue to benefit from skilled PT to improve deficits listed above during all  ADLs.  OBJECTIVE IMPAIRMENTS: Abnormal gait, decreased coordination, decreased endurance, decreased ROM, decreased strength, increased fascial restrictions, impaired flexibility, improper body mechanics, postural dysfunction, and pain.   ACTIVITY LIMITATIONS: carrying, lifting, bending, squatting, continence, locomotion level, and caring for others  PARTICIPATION LIMITATIONS: interpersonal relationship, community activity, and occupation  PERSONAL FACTORS: 1-2 comorbidities: see above  are also affecting patient's functional outcome.   REHAB POTENTIAL: Good  CLINICAL DECISION MAKING: Stable/uncomplicated  EVALUATION COMPLEXITY: Low   GOALS: Goals reviewed with patient? Yes  SHORT TERM GOALS: Target date: for all STGs: 02/26/23  Pt will be IND in HEP to improve strength, posture, and flexibility. Baseline: no HEP Goal status: INITIAL  2.  Finish exam and write goals as indicated. Baseline: limited 2/2 time constraints Goal status: MET  3.  Complete FOTO and write goals as indicated. Baseline: not performed Goal status: INITIAL  4.  Pt will demo proper toileting posture to fully empty bladder and reduce strain during bowel movements. Baseline: unable to demo Goal status: INITIAL  5.  Assess running and write goal. Baseline: limited 2/2 time constraints Goal status: INITIAL   LONG TERM GOALS: Target date: for all LTGs: 03/26/23  Pt will demonstrate improvement of pelvic floor muscles (PFM) contraction and relaxation with coordination of breath to report no leakage and wearing <1 pad/day in last two weeks. Baseline: 1-2 pads/day Goal status: INITIAL  2.  Pt will be IND in performing and verbalizing birthing prep  activities. Baseline: unable to perform Goal status: INITIAL  3.  Pt will demonstrate improved hip/LE strength and PFM relaxation techniques to decr. Pain during intercourse to improve QOL. Baseline: pain with initial penetration. Goal status: INITIAL  4.    Pt will be demonstrate improved TrA strength by demonstrating </=1 fingers width of diastasis recti which will improve leakage and pain. Baseline: 2-3 fingers width sup. And at umbilicus. Goal status: INITIAL   PLAN: See clinical impression  PT FREQUENCY: 1x/week  PT DURATION: 8 weeks  PLANNED INTERVENTIONS: Therapeutic exercises, Therapeutic activity, Neuromuscular re-education, Balance training, Gait training, Patient/Family education, Self Care, Joint mobilization, Joint manipulation, Dry Needling, Spinal manipulation, Spinal mobilization, Taping, Biofeedback, Manual therapy, and Re-evaluation     Mariane Masters, PT 03/27/2023, 8:10 AM

## 2023-03-27 NOTE — Patient Instructions (Signed)
1) strengthen hip abduction   GREEN BAND at thigh and hands by hips as stabilizers  Deep core level 2   Inhale, smell soup quiet breath Exhale quiet, move knee 15 deg on pinky ballmound , heel off foot  6 min  Strengthening feet arches:    2)  Feet slides :  GREEN BAND at thigh and hands by hips as stabilizers    Points of contact at sitting bones  Four points of contact of foot,  Side knee back while keeping knee out along 2-3rd toe line   Heel up, ankle not twist out Lower heel while keeping knee out along 2-3rd toe line Four points of contact of foot, Slide foot back while keeping knee out along 2-3rd toe line   Repeated with other foot    2 min __   Higher knees, landing ballmounds, feet wider  Walking   __

## 2023-04-01 ENCOUNTER — Inpatient Hospital Stay (HOSPITAL_COMMUNITY)
Admission: AD | Admit: 2023-04-01 | Discharge: 2023-04-01 | Disposition: A | Discharge status: Home / Self Care | Payer: Medicaid Other | Attending: Obstetrics and Gynecology | Admitting: Obstetrics and Gynecology

## 2023-04-01 ENCOUNTER — Encounter (HOSPITAL_COMMUNITY): Payer: Self-pay | Admitting: Obstetrics and Gynecology

## 2023-04-01 ENCOUNTER — Telehealth: Payer: Self-pay | Admitting: *Deleted

## 2023-04-01 DIAGNOSIS — R102 Pelvic and perineal pain: Secondary | ICD-10-CM | POA: Insufficient documentation

## 2023-04-01 DIAGNOSIS — Z3A23 23 weeks gestation of pregnancy: Secondary | ICD-10-CM | POA: Insufficient documentation

## 2023-04-01 DIAGNOSIS — O26892 Other specified pregnancy related conditions, second trimester: Secondary | ICD-10-CM

## 2023-04-01 DIAGNOSIS — O479 False labor, unspecified: Secondary | ICD-10-CM | POA: Diagnosis not present

## 2023-04-01 LAB — WET PREP, GENITAL
Clue Cells Wet Prep HPF POC: NONE SEEN
Sperm: NONE SEEN
Trich, Wet Prep: NONE SEEN
WBC, Wet Prep HPF POC: >=10 — AB (ref <10)
Yeast Wet Prep HPF POC: NONE SEEN

## 2023-04-01 LAB — URINALYSIS, ROUTINE W REFLEX MICROSCOPIC
Bacteria, UA: NONE SEEN
Bilirubin Urine: NEGATIVE
Glucose, UA: NEGATIVE mg/dL
Hgb urine dipstick: NEGATIVE
Ketones, ur: NEGATIVE mg/dL
Nitrite: NEGATIVE
Protein, ur: NEGATIVE mg/dL
Specific Gravity, Urine: 1.008 (ref 1.005–1.030)
pH: 6 (ref 5.0–8.0)

## 2023-04-01 MED ORDER — NIFEDIPINE 10 MG PO CAPS
10.0000 mg | ORAL_CAPSULE | ORAL | Status: AC
  Administered 2023-04-01 (×3): 10 mg via ORAL
  Filled 2023-04-01 (×3): qty 1

## 2023-04-01 NOTE — MAU Provider Note (Signed)
History     CSN: [id number]  Arrival date and time: 04/01/23 1041   None     Chief Complaint  Patient presents with   Contractions   Pelvic Pain   Leg Pain   HPI Emily Flowers is a 27 y.o. G2P1001 at [redacted]w[redacted]d who presents to MAU for contractions and pelvic pain. She reports contractions started yesterday while she was at church and have continued throughout the day today. She reports she had been walking around a lot yesterday when the contractions started. She reports she sat down to rest and increase her water intake to help relieve pain which. She has not timed the contractions but reports they are more frequent and she is now having move pelvic pressure. She reports a history of pelvic floor issues so is unsure if this related to that or the contractions she has been feeling. She reports standing and walking makes the pain worse and lifting her legs up or lying at an angle helps with the pain. She has been feeling as if her legs are tingly over the past few days as well. She reports she had a sharp pain with urination yesterday but no burning or other urinary s/s. She denies vaginal bleeding or leaking fluid but has noticed an increase in discharge. She denies itching or odor. No recent intercourse or cervical exams.   Pregnancy course: uncomplicated. Receives [facility] at [facility], next appointment is scheduled on 8/21.   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   Subclinical hyperthyroidism 09/28/2022   Urge incontinence 10/04/2022    Past Surgical History:  Procedure Laterality Date   TONSILLECTOMY Bilateral 04/08/2020    Procedure: TONSILLECTOMY;  Surgeon: Linus Salmons, MD;  Location: [facility];  Service: ENT;  Laterality: Bilateral;   WISDOM TOOTH EXTRACTION      Family History  Problem Relation Age of Onset   Diabetes Father    Cancer Maternal Aunt        breast   Diabetes Paternal Aunt    Diabetes Paternal Uncle     Social History   Tobacco Use   Smoking status: Never    Passive exposure: Never   Smokeless tobacco: Never  Vaping Use   Vaping status: Never Used  Substance Use Topics   Alcohol use: No    Alcohol/week: 0.0 standard drinks of alcohol   Drug use: No    Allergies:  Allergies  Allergen Reactions   Sulfa Antibiotics     Unknown childhood reaction    No medications prior to admission.   Review of Systems  Gastrointestinal:  Positive for abdominal pain.  Genitourinary:  Positive for pelvic pain and vaginal discharge. Negative for vaginal bleeding.  All other systems reviewed and are negative.  Physical Exam   Blood pressure (!) 112/59, pulse 91, temperature 98.1 F (  36.7 C), temperature source Oral, resp. rate 18, height 5\' 6"  (1.676 m), weight 65.3 kg, last menstrual period 10/10/2022, SpO2 100%.  Physical Exam Vitals and nursing note reviewed. Exam conducted with a chaperone present.  Constitutional:      General: She is not in acute distress. Eyes:     Extraocular Movements: Extraocular movements intact.     Pupils: Pupils are equal, round, and reactive to light.  Cardiovascular:     Rate and Rhythm: Normal rate.  Pulmonary:     Effort: Pulmonary effort is normal. No respiratory distress.  Abdominal:     Palpations: Abdomen is soft.     Tenderness: There is no abdominal tenderness.     Comments: Gravid   Genitourinary:    Comments: Normal external female genitalia, vaginal walls pink and well-rugated, no blood, no pooling of amniotic fluid, moderate amount of thick white discharge, cervix visually closed without  lesions/masses Musculoskeletal:        General: Normal range of motion.     Cervical back: Normal range of motion.  Skin:    General: Skin is warm and dry.  Neurological:     General: No focal deficit present.     Mental Status: She is alert and oriented to person, place, and time.  Psychiatric:        Mood and Affect: Mood normal.        Behavior: Behavior normal.   Dilation: Closed Effacement (%): Thick Cervical Position: Posterior Exam by:: Camelia Eng, CNM  NST FHR: 140 bpm, moderate variability, +10x10 accels, +occasional variable Toco: Q4-6 mins  Results for orders placed or performed during the hospital encounter of 04/01/23 (from the past 24 hour(s))  Urinalysis, Routine w reflex microscopic -Urine, Clean Catch     Status: Abnormal   Collection Time: 04/01/23 11:32 AM  Result Value Ref Range   Color, Urine STRAW (A) YELLOW   APPearance CLEAR CLEAR   Specific Gravity, Urine 1.008 1.005 - 1.030   pH 6.0 5.0 - 8.0   Glucose, UA NEGATIVE NEGATIVE mg/dL   Hgb urine dipstick NEGATIVE NEGATIVE   Bilirubin Urine NEGATIVE NEGATIVE   Ketones, ur NEGATIVE NEGATIVE mg/dL   Protein, ur NEGATIVE NEGATIVE mg/dL   Nitrite NEGATIVE NEGATIVE   Leukocytes,Ua TRACE (A) NEGATIVE   RBC / HPF 0-5 0 - 5 RBC/hpf   WBC, UA 0-5 0 - 5 WBC/hpf   Bacteria, UA NONE SEEN NONE SEEN   Squamous Epithelial / HPF 0-5 0 - 5 /HPF   Mucus PRESENT   Wet prep, genital     Status: Abnormal   Collection Time: 04/01/23 12:26 PM   Specimen: Vaginal  Result Value Ref Range   Yeast Wet Prep HPF POC NONE SEEN NONE SEEN   Trich, Wet Prep NONE SEEN NONE SEEN   Clue Cells Wet Prep HPF POC NONE SEEN NONE SEEN   WBC, Wet Prep HPF POC >=10 (A) <10   Sperm NONE SEEN     MAU Course  Procedures  MDM UA, culture Wet prep, GC/CT Procardia series  UA negative however given patients discomfort with urination will add urine culture. FFN collected, but not sent as cervix closed/thick. Wet prep negative,  GC/CT pending. Patient found to be contracting. Discussed Procardia series which patient agrees to try. After receiving 3 doses of Procardia she reports contractions have completely resolved, pain 0/10. She declined repeat cervical exam. NST reassuring for gestational age.   Assessment and Plan   1. [redacted] weeks gestation of  pregnancy   2. Uterine contractions during pregnancy   3. Pelvic pain affecting pregnancy in second trimester, antepartum    - Discharge home in stable condition - Return precautions given. Return to MAU as needed - Keep OB appointment as scheduled  Brand Males, CNM 04/01/2023, 3:06 PM

## 2023-04-01 NOTE — Telephone Encounter (Signed)
Called with complaints of braxton hicks and pressure all day yesterday, rested and hydrated as well and still having same complaints, just feels uneasy about the pain. Is currently getting pelvic floor pt, denies and VB or LOF, just increase in discharge. Advised to go to MAU since we have no provider this AM and she may longer observation than an office visit. Pt verbalizes and understands and informed I will let MAU providers know she will be coming

## 2023-04-01 NOTE — MAU Note (Signed)
Emily Flowers is a 27 y.o. at [redacted]w[redacted]d here in MAU reporting: ctx since yesterday. Pt states they have been consistent enough to notice them but pt has not been timing them. Pt also states she has constant pressure in her pelvis. Pt states yesterday she had pain with urination and has noticed an increase in white/clear thick discharge. Since last week pt states she has had cramping and a tingling sensation in her legs. +FM. Pt denies LOF and VB.   Onset of complaint: Leg cramping and tingle since last week  Ctx since yesterday  Pain score: 5/10 lower abdomen  7/10 pressure in pelvis  Vitals:   04/01/23 1112  BP: 119/61  Pulse: 71  Resp: 18  Temp: 98.1 F (36.7 C)  SpO2: 100%     FHT:147 Lab orders placed from triage:  UA

## 2023-04-04 ENCOUNTER — Ambulatory Visit: Payer: Medicaid Other | Attending: Urology | Admitting: Physical Therapy

## 2023-04-04 VITALS — BP 102/62

## 2023-04-04 DIAGNOSIS — M533 Sacrococcygeal disorders, not elsewhere classified: Secondary | ICD-10-CM | POA: Insufficient documentation

## 2023-04-04 DIAGNOSIS — N3946 Mixed incontinence: Secondary | ICD-10-CM

## 2023-04-04 DIAGNOSIS — R2689 Other abnormalities of gait and mobility: Secondary | ICD-10-CM | POA: Diagnosis present

## 2023-04-04 DIAGNOSIS — M6281 Muscle weakness (generalized): Secondary | ICD-10-CM

## 2023-04-04 NOTE — Therapy (Addendum)
OUTPATIENT PHYSICAL THERAPY FEMALE PELVIC TREATMENT / Recert    Patient Name: Emily Flowers MRN: [id number] DOB:[birth year], 27 y.o., female Today's Date: 04/04/2023  END OF SESSION:  PT End of Session - 04/04/23 0826     Visit Number 5    Number of Visits 15    Date for PT Re-Evaluation 06/13/23    Authorization Type [employer] and [employer]    Progress Note Due on Visit 10    PT Start Time 0806    PT Stop Time 0850    PT Time Calculation (min) 44 min    Activity Tolerance Patient tolerated treatment well    Behavior During Therapy [facility] for tasks assessed/performed             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 Andi Hence per pt  REFERRING PROVIDER: Dr. Irineo Axon  REFERRING DIAG: Urge incontinence  THERAPY DIAG:  Mixed incontinence  Other abnormalities of gait and mobility  Muscle weakness (generalized)  Rationale for Evaluation and Treatment: Rehabilitation  ONSET DATE: 10/15/22 referral date  SUBJECTIVE:      SUBJECTIVE STATEMENT TODAY:  Pt reports she has put in her notice to stop working in Oct. She has not eaten anything this morning nor drank anything. Pt went to ED with pelvic pain and was having contractions. Pt was discharged and did not stay overnight.                                                                                                                                                                                   SUBJECTIVE STATEMENT on 01/29/23 : Pt is 21 weeks with her 2nd pregnancy.  Pt had minor stitches with her home birth pregnancy with her 1st child.  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 Bowel movements occur every 2-3 days. Pt strains 20% of the time.  Pt says she needs to increase her water intake. Pt was a Therapist, music.  Pt is walking mainly. Pt would like to begin to a moderate weight lifting. Pt realized HIIT classes was too much for her. Pt would like get back to swimming.   Pt is feeling pubic pain when she has been doing a lot of bending. Daughter is 17 months and 24 lbs. Pt holds her dtr on her R hip .  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   PAIN:  Are you having pain? No 02/05/23 NPRS scale: 0/10 Pain location:  none  Pain type: none Pain description:  none    Aggravating factors: n/a Relieving factors: n/a  PRECAUTIONS: None  WEIGHT BEARING RESTRICTIONS: No  FALLS:  Has patient fallen in last 6 months? 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  PLOF: Independent  PATIENT GOALS: I would like a stronger pelvic floor and stopped leakage and regular bowel movements and be able to exercise without discomfort.  PERTINENT HISTORY:  Heart murmur and chart states hyperthyroidism but pt states that is incorrect. Sexual abuse: No  BOWEL MOVEMENT: Pain with bowel movement: Yes Type of bowel movement:Frequency every day to every 2-3 days. Fully empty rectum: No not always Leakage: No Pads: No Fiber  supplement: No  URINATION: Pain with urination: No Fully empty bladder: Yes:   Stream:  it varies, she feels like the stream is weak at times. Urgency: Yes: with leakage Frequency: voids 1-2 times every 2-4 hours. Leakage: Urge to void, Walking to the bathroom, and Sneezing Pads: Yes: 1-2 pantyliners/day  INTERCOURSE: Pain with intercourse: Initial Penetration Ability to have vaginal penetration:  Yes:   Climax: no Marinoff Scale: 0/3  PREGNANCY: Vaginal deliveries: one Tearing Yes: small tear C-section deliveries: none Currently pregnant Yes: 15 weeks  PROLAPSE: None   OBJECTIVE:   DIAGNOSTIC FINDINGS:  N/a  PATIENT SURVEYS:   COGNITION: Overall cognitive status: Within functional limits for tasks assessed     SENSATION: Appears intatct  GAIT: Distance walked: 105' Assistive device utilized: None Level of assistance: Complete Independence Comments: decr. Trunk rotation  POSTURE: increased lumbar lordosis, decreased thoracic kyphosis, and anterior pelvic tilt  PELVIC ALIGNMENT: ant. Pelvic tilt, no obliquity noted  LUMBARAROM/PROM:   A/PROM A/PROM  eval  Flexion WFL  Extension WFL, 3/10 LBP  Right lateral flexion WFL with L low back pulling sensation  Left lateral flexion WFL  Right rotation WFL  Left rotation WFL   (Blank rows = not tested)  LOWER EXTREMITY ROM:  Active ROM Right eval Left eval  Hip flexion [facility]  Hip extension    Hip abduction    Hip adduction    Hip internal rotation    Hip external rotation    Knee flexion [facility]  Knee extension [facility]  Ankle dorsiflexion [facility]  Ankle plantarflexion    Ankle inversion    Ankle eversion     (Blank rows = not tested)  LOWER EXTREMITY MMT:  MMT Right eval Left eval  Hip flexion 4/5 4/5  Hip extension    Hip abduction 2/5 2/5  Hip adduction 3/5 3/5  Hip internal rotation    Hip external rotation    Knee flexion 4/5 4/5  Knee extension 5/5 5/5  Ankle dorsiflexion 5/5  5/5  Ankle plantarflexion    Ankle inversion    Ankle eversion      [facility] PT Assessment - 04/04/23 0827       Coordination   Coordination and Movement Description chest breathing with deep core training ( dyscoordination)           Vital signs:  8:15am  92/62 mm Hg in semi reclined position 8:30am 102/68 mm Hg in semi reclined position     [facility] Adult PT Treatment/Exercise - 04/04/23 1610       Neuro Re-ed    Neuro Re-ed Details  cued for deep core level 1-2 ( cued  less chest breathing) , cued for stretches   Cued for how to don/ doff SIJ belt      Manual Therapy   Manual therapy comments STM/MWM at posterior pelvic floor to decrease tensions,             PATIENT EDUCATION:  Education details: PT educated pt on further exam findings and IAP management to reduce leakage, pain, and DR. Person educated: Patient and Spouse Education method: Explanation, Demonstration, Verbal cues, and Handouts Education comprehension: verbalized understanding, returned demonstration, and needs further education  HOME EXERCISE PROGRAM: [employer]: [id number].  ASSESSMENT:  CLINICAL IMPRESSION: Due to pt's visit to ED on 04/01/23, therapist monitored her BP today. First reading was low and then reading was normal. Pt reported she had not eaten anything this morning nor drank anything before coming to appt. Graham crackers and water were provided.   Initiated deep core HEP in semi reclined position. Explained technique to help with relaxation and stress relief as well as support deep core system. External manual Tx was applied through clothing in semi reclined position to release tight posterior pelvic floor mm. Pelvic stretches and hamstring stretches were instructed. Pt demo'd increased pelvic mm mobility and less tenderness to palpation at these areas post training.    Provided SIJ belt to wear for more pelvic support. Cued for don/ doff. Pt demo'd IND with don/doff SIJ.   Regional  interdependence approach is helpful for pt as pt demo'd hallux valgus, IR of knee which contribute to weaker pelvic stability. Plan to address  help strengthen posterior chain, LKC , pelvic stability. 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   Pt has progressed well towards goals with more levelled pelvic girdle and spine.   Pt would continue to benefit from skilled PT to improve deficits listed above during all ADLs.  OBJECTIVE IMPAIRMENTS: Abnormal gait, decreased coordination, decreased endurance, decreased ROM, decreased strength, increased fascial restrictions, impaired flexibility, improper body mechanics, postural dysfunction, and pain.   ACTIVITY LIMITATIONS: carrying, lifting, bending, squatting, continence, locomotion level, and caring for others  PARTICIPATION LIMITATIONS: interpersonal relationship, community activity, and occupation  PERSONAL FACTORS: 1-2 comorbidities: see above  are also affecting patient's functional outcome.   REHAB POTENTIAL: Good  CLINICAL DECISION MAKING: Stable/uncomplicated  EVALUATION COMPLEXITY: Low   GOALS: Goals reviewed with patient? Yes  SHORT TERM GOALS: Target date: for all STGs: 02/26/23  Pt will be IND in HEP to improve strength, posture, and flexibility. Baseline: no HEP Goal status:MET   2.  Finish exam and write goals as indicated. Baseline: limited 2/2 time constraints Goal status: MET  3.  Complete FOTO and write goals as indicated. Baseline: not performed Goal status: INITIAL  4.  Pt will demo proper toileting posture to fully empty bladder  and reduce strain during bowel movements. Baseline: unable to demo Goal status: INITIAL  5.  Assess running and write goal. Baseline: limited 2/2 time constraints Goal status: INITIAL   LONG TERM GOALS: Target date: for all LTGs: 06/13/23   Pt will demonstrate improvement of pelvic floor muscles (PFM)  contraction and relaxation with coordination of breath to report no leakage and wearing <1 pad/day in last two weeks. Baseline: 1-2 pads/day Goal status:  Ongoing   2.  Pt will be IND in performing and verbalizing birthing prep activities. Baseline: unable to perform Goal status: Ongoing   3.  Pt will demonstrate improved hip/LE strength and PFM relaxation techniques to decr. Pain during intercourse to improve QOL. Baseline: pain with initial penetration. Goal status: Ongoing   4.   Pt will be demonstrate improved TrA strength by demonstrating </=1 fingers width of diastasis recti which will improve leakage and pain. Baseline: 2-3 fingers width sup. And at umbilicus. Goal status: Ongoing   PLAN: See clinical impression  PT FREQUENCY: 1x/week  PT DURATION: 10 weeks   PLANNED INTERVENTIONS: Therapeutic exercises, Therapeutic activity, Neuromuscular re-education, Balance training, Gait training, Patient/Family education, Self Care, Joint mobilization, Joint manipulation, Dry Needling, Spinal manipulation, Spinal mobilization, Taping, Biofeedback, Manual therapy, and Re-evaluation     Mariane Masters, PT 04/04/2023, 1:49 PM

## 2023-04-11 ENCOUNTER — Ambulatory Visit: Payer: Medicaid Other | Admitting: Physical Therapy

## 2023-04-17 ENCOUNTER — Other Ambulatory Visit: Payer: Medicaid Other

## 2023-04-17 ENCOUNTER — Encounter: Payer: Self-pay | Admitting: Family Medicine

## 2023-04-17 ENCOUNTER — Ambulatory Visit (INDEPENDENT_AMBULATORY_CARE_PROVIDER_SITE_OTHER): Payer: Medicaid Other | Admitting: Family Medicine

## 2023-04-17 VITALS — BP 101/60 | HR 71 | Wt 144.0 lb

## 2023-04-17 DIAGNOSIS — Z1339 Encounter for screening examination for other mental health and behavioral disorders: Secondary | ICD-10-CM | POA: Diagnosis not present

## 2023-04-17 DIAGNOSIS — Z3A26 26 weeks gestation of pregnancy: Secondary | ICD-10-CM

## 2023-04-17 DIAGNOSIS — Z3480 Encounter for supervision of other normal pregnancy, unspecified trimester: Secondary | ICD-10-CM

## 2023-04-17 DIAGNOSIS — O99019 Anemia complicating pregnancy, unspecified trimester: Secondary | ICD-10-CM | POA: Diagnosis not present

## 2023-04-17 DIAGNOSIS — Z23 Encounter for immunization: Secondary | ICD-10-CM | POA: Diagnosis not present

## 2023-04-17 DIAGNOSIS — Z148 Genetic carrier of other disease: Secondary | ICD-10-CM

## 2023-04-17 DIAGNOSIS — Z3482 Encounter for supervision of other normal pregnancy, second trimester: Secondary | ICD-10-CM

## 2023-04-17 NOTE — Progress Notes (Signed)
ROB   2 hr GTT   T-dap offered. Pt will receive  MAU visit on 04/01/23 for Ctx.   Pt states she wants to start magnesium for leg cramps  states since receiving procardia at Hospital B/P readings have been low. Lower than usual.  Pt states she is feeling better no dizziness, no visual changes.

## 2023-04-17 NOTE — Progress Notes (Signed)
    PRENATAL VISIT NOTE  Subjective:  Emily Flowers is a 27 y.o. G2P1001 at [redacted]w[redacted]d being seen today for ongoing prenatal care.  She is currently monitored for the following issues for this low-risk pregnancy and has Supervision of other normal pregnancy, antepartum and Carrier of spinal muscular atrophy on their problem list.  Patient reports no complaints.  Contractions: Not present. Vag. Bleeding: None.  Movement: Present. Denies leaking of fluid.   The following portions of the patient's history were reviewed and updated as appropriate: allergies, current medications, past family history, past medical history, past social history, past surgical history and problem list.   Objective:   Vitals:   04/17/23 0910  BP: 101/60  Pulse: 71  Weight: 144 lb (65.3 kg)    Fetal Status: Fetal Heart Rate (bpm): 142 Fundal Height: 24 cm Movement: Present     General:  Alert, oriented and cooperative. Patient is in no acute distress.  Skin: Skin is warm and dry. No rash noted.   Cardiovascular: Normal heart rate noted  Respiratory: Normal respiratory effort, no problems with respiration noted  Abdomen: Soft, gravid, appropriate for gestational age.  Pain/Pressure: Absent     Pelvic: Cervical exam deferred        Extremities: Normal range of motion.  Edema: None  Mental Status: Normal mood and affect. Normal behavior. Normal judgment and thought content.   Assessment and Plan:  Pregnancy: G2P1001 at [redacted]w[redacted]d 1. Supervision of other normal pregnancy, antepartum 28 week labs and TDaP today  2. Carrier of spinal muscular atrophy Partner is negative  3. Need for Tdap vaccination - Tdap vaccine greater than or equal to 7yo IM  Preterm labor symptoms and general obstetric precautions including but not limited to vaginal bleeding, contractions, leaking of fluid and fetal movement were reviewed in detail with the patient. Please refer to After Visit Summary for other counseling recommendations.    Return in 2 weeks (on 05/01/2023).  Future Appointments  Date Time Provider Department Center  04/18/2023  8:00 AM Mariane Masters, PT [facility] None  04/25/2023  8:00 AM Dayle Points, Shin-Yiing, PT [facility] None  04/30/2023  9:15 AM [facility] None  04/30/2023  9:30 AM Creig Hines, MD [facility] None  05/08/2023  8:00 AM Mariane Masters, PT [facility] None  05/15/2023  8:00 AM Mariane Masters, PT [facility] None  05/17/2023  8:35 AM Federico Flake, MD [facility]  05/22/2023  8:00 AM Mariane Masters, PT [facility] None  05/29/2023  8:00 AM Mariane Masters, PT [facility] None  05/30/2023  8:35 AM Anyanwu, Jethro Bastos, MD [facility]  06/05/2023  8:00 AM Mariane Masters, PT [facility] None  06/12/2023  8:00 AM Mariane Masters, PT [facility] None  06/13/2023  8:35 AM Federico Flake, MD [facility]  06/19/2023  8:00 AM Mariane Masters, PT [facility] None  06/27/2023  8:35 AM [place] Bing, MD [facility]    Reva Bores, MD

## 2023-04-18 ENCOUNTER — Ambulatory Visit: Payer: Medicaid Other | Admitting: Physical Therapy

## 2023-04-18 LAB — CBC
Hematocrit: 31.7 % — ABNORMAL LOW (ref 34.0–46.6)
Hemoglobin: 10.5 g/dL — ABNORMAL LOW (ref 11.1–15.9)
MCH: 32 pg (ref 26.6–33.0)
MCHC: 33.1 g/dL (ref 31.5–35.7)
MCV: 97 fL (ref 79–97)
Platelets: 171 10*3/uL (ref 150–450)
RBC: 3.28 x10E6/uL — ABNORMAL LOW (ref 3.77–5.28)
RDW: 12.9 % (ref 11.7–15.4)
WBC: 5.5 10*3/uL (ref 3.4–10.8)

## 2023-04-18 LAB — GLUCOSE TOLERANCE, 2 HOURS W/ 1HR
Glucose, 1 hour: 92 mg/dL (ref 70–179)
Glucose, 2 hour: 112 mg/dL (ref 70–152)
Glucose, Fasting: 67 mg/dL — ABNORMAL LOW (ref 70–91)

## 2023-04-18 LAB — HIV ANTIBODY (ROUTINE TESTING W REFLEX): HIV Screen 4th Generation wRfx: NONREACTIVE

## 2023-04-18 LAB — RPR: RPR Ser Ql: NONREACTIVE

## 2023-04-25 ENCOUNTER — Ambulatory Visit: Payer: Medicaid Other | Admitting: Physical Therapy

## 2023-04-25 DIAGNOSIS — M6281 Muscle weakness (generalized): Secondary | ICD-10-CM

## 2023-04-25 DIAGNOSIS — N3946 Mixed incontinence: Secondary | ICD-10-CM | POA: Diagnosis not present

## 2023-04-25 DIAGNOSIS — R2689 Other abnormalities of gait and mobility: Secondary | ICD-10-CM

## 2023-04-25 NOTE — Addendum Note (Signed)
Addended by: Mariane Masters on: 04/25/2023 03:40 PM   Modules accepted: Orders

## 2023-04-25 NOTE — Patient Instructions (Signed)
Functional positions Semi tandem stand , 45 deg turn to changing table, car seat , standing at work at the counter To engage legs, pelvis    NO standing with hips to the side when holding baby   Soothing baby in arms: semi tandem stance, rocking back and forth walk side ways + mini squat  Facing strolle or playing on the floor: leg lifts standing or  do on all fours    __    Clam Shell 45 Degrees  Lying with hips and knees bent 45, one pillow between knees and ankles. Heel together, toes apart like ballerina,  Lift knee with exhale while pressing heels together. Be sure pelvis does not roll backward. Do not arch back. Do 20 times, each leg, 2 times per day.    Complimentary stretch:  Figure-4  with a strap, elbows and shoulder down   3 breaths    Alternative if hip is tight,   Sit at 45 deg turn with R leg and knee on edge of chair/ bench, L buttock hanging off the edge to bring the L foot back like a lunge, toes bent, lower heel to feel quad stretch,  pay attention to keeping pinky and first toe ballmound planted to align toes forward so ankles are not twerked   Repeat with other side   ___   Seated deep core level 1-2 at dinner time   __ Minisquat: Scoot buttocks back slight, hinge like you are looking at your reflection on a pond  Knees behind toes,  Inhale to "smell flowers"  Exhale on the rise "like rocket"  Do not lock knees, have more weight across ballmounds of feet, toes relaxed and spread them, not grip them   10 reps x 3 x day

## 2023-04-25 NOTE — Therapy (Addendum)
OUTPATIENT PHYSICAL THERAPY FEMALE PELVIC TREATMENT  / Discharge Summary across 6 visits   Patient Name: Emily Flowers MRN: [id number] DOB:04-Mar-1996, 27 y.o., female Today's Date: 04/25/2023  END OF SESSION:  PT End of Session - 04/25/23 0849     Visit Number 6    Number of Visits 9    Date for PT Re-Evaluation 06/13/23    Authorization Type [employer] and [employer]    Progress Note Due on Visit 10    PT Start Time 0804    PT Stop Time 0850    PT Time Calculation (min) 46 min    Activity Tolerance Patient tolerated treatment well    Behavior During Therapy [facility] for tasks assessed/performed               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 Andi Hence per pt  REFERRING PROVIDER: Dr. Irineo Axon  REFERRING DIAG: Urge incontinence  THERAPY DIAG:  Mixed incontinence  Muscle weakness (generalized)  Other abnormalities of gait and mobility  Rationale for Evaluation and Treatment: Rehabilitation  ONSET DATE: 10/15/22 referral date  SUBJECTIVE:      SUBJECTIVE STATEMENT TODAY:  Pt reports she would like to return to working in the gym but she has not been doing any gym workout prior to pregnancy but enjoyed it when she was running track in college. Pt has not eaten anything this  morning.                                                                                                                                                                        SUBJECTIVE STATEMENT on 01/29/23 : Pt is 21 weeks with her 2nd pregnancy.  Pt had minor stitches with her home birth pregnancy with her 1st child.  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 Bowel movements occur every 2-3 days. Pt strains 20% of the time.  Pt says she needs to increase her water intake. Pt was a Therapist, music. Pt is walking mainly. Pt  would like to begin to a moderate weight lifting. Pt realized HIIT classes was too much for her. Pt would like get back to swimming.   Pt is feeling pubic pain when she has been doing a lot of bending. Daughter is 17 months and 24 lbs. Pt holds her dtr on her R hip .  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   PAIN:  Are you having pain? No 02/05/23 NPRS scale: 0/10 Pain location:  none  Pain type: none Pain description:  none    Aggravating factors: n/a Relieving factors: n/a  PRECAUTIONS: None  WEIGHT BEARING RESTRICTIONS: No  FALLS:  Has patient fallen in last 6 months? 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  PLOF: Independent  PATIENT GOALS: I would like a stronger pelvic floor and stopped leakage and regular bowel movements and be able to exercise without discomfort.  PERTINENT HISTORY:  Heart murmur and chart states hyperthyroidism but pt states that is incorrect. Sexual abuse: No  BOWEL MOVEMENT: Pain with bowel movement: Yes Type of bowel movement:Frequency every day to every 2-3 days. Fully empty rectum: No not always Leakage:  No Pads: No Fiber supplement: No  URINATION: Pain with urination: No Fully empty bladder: Yes:   Stream:  it varies, she feels like the stream is weak at times. Urgency: Yes: with leakage Frequency: voids 1-2 times every 2-4 hours. Leakage: Urge to void, Walking to the bathroom, and Sneezing Pads: Yes: 1-2 pantyliners/day  INTERCOURSE: Pain with intercourse: Initial Penetration Ability to have vaginal penetration:  Yes:   Climax: no Marinoff Scale: 0/3  PREGNANCY: Vaginal deliveries: one Tearing Yes: small tear C-section deliveries: none Currently pregnant Yes: 15 weeks  PROLAPSE: None   OBJECTIVE:   DIAGNOSTIC FINDINGS:  N/a  PATIENT SURVEYS:   COGNITION: Overall cognitive status: Within functional limits for tasks assessed     SENSATION: Appears intatct  GAIT: Distance walked: 53' Assistive device utilized: None Level of assistance: Complete Independence Comments: decr. Trunk rotation  POSTURE: increased lumbar lordosis, decreased thoracic kyphosis, and anterior pelvic tilt  PELVIC ALIGNMENT: ant. Pelvic tilt, no obliquity noted  LUMBARAROM/PROM:   A/PROM A/PROM  eval  Flexion WFL  Extension WFL, 3/10 LBP  Right lateral flexion WFL with L low back pulling sensation  Left lateral flexion WFL  Right rotation WFL  Left rotation WFL   (Blank rows = not tested)  LOWER EXTREMITY ROM:  Active ROM Right eval Left eval  Hip flexion [facility]  Hip extension    Hip abduction    Hip adduction    Hip internal rotation    Hip external rotation    Knee flexion [facility]  Knee extension [facility]  Ankle dorsiflexion [facility]  Ankle plantarflexion    Ankle inversion    Ankle eversion     (Blank rows = not tested)  LOWER EXTREMITY MMT:  MMT Right eval Left eval  Hip flexion 4/5 4/5  Hip extension    Hip abduction 2/5 2/5  Hip adduction 3/5 3/5  Hip internal rotation    Hip external rotation    Knee flexion 4/5 4/5  Knee extension 5/5 5/5  Ankle  dorsiflexion 5/5 5/5  Ankle plantarflexion  Ankle inversion    Ankle eversion      Vital signs:  8:15am 102/77 mm Hg   [facility] PT Assessment - 04/25/23 1728       Floor to Stand   Comments half kneeling , bending with simulated lifting toddler with IR knees      Strength   Overall Strength Comments R hip abd 3+/5 , L 4/5             [facility] Adult PT Treatment/Exercise - 04/25/23 1726       Therapeutic Activites    Other Therapeutic Activities answered pt's questions about returning to fitness, explained future plan tfor well rounded routine, advised pt to eat before coming to PT for overall health , encouraged pt to walking 15 min x 2 a day for cardio      Neuro Re-ed    Neuro Re-ed Details  cued for seated deep core to enhance compliance,  added clamshell strengthening, cued for semi tandem stand technique for stand<> floor and  lifting toddler and baby to promote pelvic girdle stability              PATIENT EDUCATION:  Education details: PT educated pt on further exam findings and IAP management to reduce leakage, pain, and DR. Person educated: Patient and Spouse Education method: Explanation, Demonstration, Verbal cues, and Handouts Education comprehension: verbalized understanding, returned demonstration, and needs further education  HOME EXERCISE PROGRAM: Medbridge: [id number].  ASSESSMENT:  CLINICAL IMPRESSION on 04/25/23: Initiated deep core HEP in seated position to enhance compliance.   Answered pt's questions about returning to fitness, explained future plan tfor well rounded routine, advised pt to eat before coming to PT for overall health , encouraged pt to walking 15 min x 2 a day for cardio   Cued for seated deep core to enhance compliance,  added clamshell strengthening, cued for semi tandem stand technique for stand<> floor and  lifting toddler and baby to promote pelvic girdle stability.   Regional interdependence approach is helpful for pt as pt  demo'd hallux valgus, IR of knee which contribute to weaker pelvic stability. Plan to address  help strengthen posterior chain, LKC , pelvic stability. 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   Plan to add fitness exercises next session based on pt's request.  Encouraged pt to eat before coming to PT next session as pt need proper nutrition for self and baby when starting to perform fitness exercises.   Pt voiced agreement.   Plan to add resistance band strengthening as option to dumb bells as pt would like to return to weight lifting. Encouraged pt remain compliant with deep core HEP before adding weight lifting. Pt voiced agreement.   Pt would continue to benefit from skilled PT to improve deficits listed above during all ADLs.   Update 05/08/23: Front desk staff received message from pt "Kimora called this morning and requested to cancel remaining appointments. She said she had to focus on something else first."   Pt is self d/c at this time.    OBJECTIVE IMPAIRMENTS: Abnormal gait, decreased coordination, decreased endurance, decreased ROM, decreased strength, increased fascial restrictions, impaired flexibility, improper body mechanics, postural dysfunction, and pain.   ACTIVITY LIMITATIONS: carrying, lifting, bending, squatting, continence, locomotion level, and caring for others  PARTICIPATION LIMITATIONS: interpersonal relationship, community activity, and occupation  PERSONAL FACTORS: 1-2 comorbidities: see above  are also affecting patient's functional  outcome.   REHAB POTENTIAL: Good  CLINICAL DECISION MAKING: Stable/uncomplicated  EVALUATION COMPLEXITY: Low   GOALS: Goals reviewed with patient? Yes  SHORT TERM GOALS: Target date: for all STGs: 02/26/23  Pt will be IND in HEP to improve strength, posture, and flexibility. Baseline: no HEP Goal status:MET   2.  Finish exam and write goals as  indicated. Baseline: limited 2/2 time constraints Goal status: MET  3.  Complete FOTO and write goals as indicated. Baseline: not performed Goal status: INITIAL  4.  Pt will demo proper toileting posture to fully empty bladder and reduce strain during bowel movements. Baseline: unable to demo Goal status: INITIAL  5.  Assess running and write goal. Baseline: limited 2/2 time constraints Goal status: INITIAL   LONG TERM GOALS: Target date: for all LTGs: 06/13/23   Pt will demonstrate improvement of pelvic floor muscles (PFM) contraction and relaxation with coordination of breath to report no leakage and wearing <1 pad/day in last two weeks. Baseline: 1-2 pads/day Goal status:  Not met  2.  Pt will be IND in performing and verbalizing birthing prep activities. Baseline: unable to perform Goal status: Not met  3.  Pt will demonstrate improved hip/LE strength and PFM relaxation techniques to decr. Pain during intercourse to improve QOL. Baseline: pain with initial penetration. Goal status: Not met  4.   Pt will be demonstrate improved TrA strength by demonstrating </=1 fingers width of diastasis recti which will improve leakage and pain. Baseline: 2-3 fingers width sup. And at umbilicus. Goal status: Not met    PLAN: See clinical impression  PT FREQUENCY: 1x/week  PT DURATION:10 weeks  PLANNED INTERVENTIONS: Therapeutic exercises, Therapeutic activity, Neuromuscular re-education, Balance training, Gait training, Patient/Family education, Self Care, Joint mobilization, Joint manipulation, Dry Needling, Spinal manipulation, Spinal mobilization, Taping, Biofeedback, Manual therapy, and Re-evaluation     Mariane Masters, PT 04/25/2023, 3:28 PM

## 2023-04-30 ENCOUNTER — Inpatient Hospital Stay (HOSPITAL_BASED_OUTPATIENT_CLINIC_OR_DEPARTMENT_OTHER): Payer: Medicaid Other | Admitting: Oncology

## 2023-04-30 ENCOUNTER — Inpatient Hospital Stay: Payer: Medicaid Other | Attending: Oncology

## 2023-04-30 ENCOUNTER — Inpatient Hospital Stay: Payer: Medicaid Other

## 2023-04-30 ENCOUNTER — Encounter: Payer: Self-pay | Admitting: Oncology

## 2023-04-30 VITALS — BP 124/73 | HR 76 | Temp 96.8°F | Resp 118 | Ht 66.0 in | Wt 147.7 lb

## 2023-04-30 DIAGNOSIS — D649 Anemia, unspecified: Secondary | ICD-10-CM | POA: Insufficient documentation

## 2023-04-30 DIAGNOSIS — D708 Other neutropenia: Secondary | ICD-10-CM

## 2023-04-30 DIAGNOSIS — Z833 Family history of diabetes mellitus: Secondary | ICD-10-CM | POA: Diagnosis not present

## 2023-04-30 DIAGNOSIS — Z8616 Personal history of COVID-19: Secondary | ICD-10-CM | POA: Insufficient documentation

## 2023-04-30 DIAGNOSIS — Z9089 Acquired absence of other organs: Secondary | ICD-10-CM | POA: Diagnosis not present

## 2023-04-30 DIAGNOSIS — O99013 Anemia complicating pregnancy, third trimester: Secondary | ICD-10-CM | POA: Insufficient documentation

## 2023-04-30 DIAGNOSIS — R5383 Other fatigue: Secondary | ICD-10-CM | POA: Diagnosis not present

## 2023-04-30 DIAGNOSIS — Z803 Family history of malignant neoplasm of breast: Secondary | ICD-10-CM | POA: Diagnosis not present

## 2023-04-30 DIAGNOSIS — Z3A27 27 weeks gestation of pregnancy: Secondary | ICD-10-CM | POA: Diagnosis not present

## 2023-04-30 DIAGNOSIS — Z882 Allergy status to sulfonamides status: Secondary | ICD-10-CM | POA: Insufficient documentation

## 2023-04-30 DIAGNOSIS — Z79899 Other long term (current) drug therapy: Secondary | ICD-10-CM | POA: Diagnosis not present

## 2023-04-30 DIAGNOSIS — D709 Neutropenia, unspecified: Secondary | ICD-10-CM | POA: Diagnosis present

## 2023-04-30 LAB — CBC WITH DIFFERENTIAL/PLATELET
Abs Immature Granulocytes: 0.05 10*3/uL (ref 0.00–0.07)
Basophils Absolute: 0 10*3/uL (ref 0.0–0.1)
Basophils Relative: 1 %
Eosinophils Absolute: 0.3 10*3/uL (ref 0.0–0.5)
Eosinophils Relative: 5 %
HCT: 32.1 % — ABNORMAL LOW (ref 36.0–46.0)
Hemoglobin: 10.4 g/dL — ABNORMAL LOW (ref 12.0–15.0)
Immature Granulocytes: 1 %
Lymphocytes Relative: 27 %
Lymphs Abs: 1.7 10*3/uL (ref 0.7–4.0)
MCH: 31.7 pg (ref 26.0–34.0)
MCHC: 32.4 g/dL (ref 30.0–36.0)
MCV: 97.9 fL (ref 80.0–100.0)
Monocytes Absolute: 0.5 10*3/uL (ref 0.1–1.0)
Monocytes Relative: 9 %
Neutro Abs: 3.5 10*3/uL (ref 1.7–7.7)
Neutrophils Relative %: 57 %
Platelets: 165 10*3/uL (ref 150–400)
RBC: 3.28 MIL/uL — ABNORMAL LOW (ref 3.87–5.11)
RDW: 12.9 % (ref 11.5–15.5)
WBC: 6.1 10*3/uL (ref 4.0–10.5)
nRBC: 0 % (ref 0.0–0.2)

## 2023-04-30 LAB — ANEMIA PROFILE B
Ferritin: 16 ng/mL (ref 15–150)
Folate: 13 ng/mL (ref >3.0)
Hematocrit: 31.7 % — ABNORMAL LOW (ref 34.0–46.6)
Hemoglobin: 10.5 g/dL — ABNORMAL LOW (ref 11.1–15.9)
Iron Saturation: 34 % (ref 15–55)
Iron: 157 ug/dL (ref 27–159)
MCH: 32 pg (ref 26.6–33.0)
MCHC: 33.1 g/dL (ref 31.5–35.7)
MCV: 97 fL (ref 79–97)
Platelets: 171 10*3/uL (ref 150–450)
RBC: 3.28 x10E6/uL — ABNORMAL LOW (ref 3.77–5.28)
RDW: 12.9 % (ref 11.7–15.4)
Total Iron Binding Capacity: 467 ug/dL — ABNORMAL HIGH (ref 250–450)
UIBC: 310 ug/dL (ref 131–425)
Vitamin B-12: 457 pg/mL (ref 232–1245)
WBC: 5.5 10*3/uL (ref 3.4–10.8)

## 2023-04-30 LAB — FOLATE: Folate: 16.3 ng/mL (ref >5.9)

## 2023-04-30 LAB — SPECIMEN STATUS REPORT

## 2023-04-30 LAB — IRON AND TIBC
Iron: 48 ug/dL (ref 28–170)
Saturation Ratios: 8 % — ABNORMAL LOW (ref 10.4–31.8)
TIBC: 578 ug/dL — ABNORMAL HIGH (ref 250–450)
UIBC: 530 ug/dL

## 2023-04-30 LAB — VITAMIN B12: Vitamin B-12: 291 pg/mL (ref 180–914)

## 2023-04-30 LAB — FERRITIN: Ferritin: 7 ng/mL — ABNORMAL LOW (ref 11–307)

## 2023-04-30 NOTE — Progress Notes (Signed)
Hematology/Oncology Consult note [facility]  Telephone:([phone] [phone]  Patient Care Team: Chrismon, Jodell Cipro, PA-C (Inactive) as PCP - General (Family Medicine) Federico Flake, MD as PCP - OBGYN (Obstetrics and Gynecology) Creig Hines, MD as Consulting Physician (Oncology)   Name of the patient: Emily Flowers  [id number]  November 17, 1995   Date of visit: 04/30/23  Diagnosis-neutropenia likely benign Anemia affecting third trimester of pregnancy  Chief complaint/ Reason for visit-routine follow-up of neutropenia and anemia  Heme/Onc history: patient is a 27 year old African-American female who has been referred for leukopenia/neutropenia white cell count has been mainly fluctuating between 3.5-4.5.  In 2021 her white count did go to 2.9 and then bounced back to 3.5.  Most recent CBC from 09/27/2022 showed a white cell count of 2.1 with an H&H of 12.9/37.6 and a platelet count of 185.  Patient denies any over-the-counter herbal medications.  She was recently checked for HIV hep B and hep C as well which was all negative.   Patient noted to have a hemoglobin of 10 in her third trimester of pregnancy.  Interval history-reports some ongoing fatigue but otherwise pregnancy is coming along well without any significant side effects  ECOG PS- 0 Pain scale- 0   Review of systems- Review of Systems  Constitutional:  Positive for malaise/fatigue. Negative for chills, fever and weight loss.  HENT:  Negative for congestion, ear discharge and nosebleeds.   Eyes:  Negative for blurred vision.  Respiratory:  Negative for cough, hemoptysis, sputum production, shortness of breath and wheezing.   Cardiovascular:  Negative for chest pain, palpitations, orthopnea and claudication.  Gastrointestinal:  Negative for abdominal pain, blood in stool, constipation, diarrhea, heartburn, melena, nausea and vomiting.  Genitourinary:  Negative for dysuria, flank  pain, frequency, hematuria and urgency.  Musculoskeletal:  Negative for back pain, joint pain and myalgias.  Skin:  Negative for rash.  Neurological:  Negative for dizziness, tingling, focal weakness, seizures, weakness and headaches.  Endo/Heme/Allergies:  Does not bruise/bleed easily.  Psychiatric/Behavioral:  Negative for depression and suicidal ideas. The patient does not have insomnia.       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   Subclinical hyperthyroidism 09/28/2022   Urge incontinence 10/04/2022     Past Surgical History:  Procedure Laterality Date   TONSILLECTOMY Bilateral 04/08/2020   Procedure: TONSILLECTOMY;  Surgeon: Linus Salmons, MD;  Location: [facility];  Service: ENT;  Laterality: Bilateral;   WISDOM TOOTH EXTRACTION      Social History   Socioeconomic History   Marital status: Married    Spouse name: Not on file   Number of children: Not on file   Years of education: Not on file   Highest education level: Not on file  Occupational History   Not on file  Tobacco Use   Smoking status: Never    Passive exposure: Never   Smokeless tobacco: Never  Vaping Use   Vaping status: Never Used  Substance and Sexual Activity   Alcohol use: No    Alcohol/week: 0.0 standard drinks of alcohol   Drug use: No   Sexual activity: Yes  Partners: Male    Birth control/protection: Condom  Other Topics Concern   Not on file  Social History Narrative   ** Merged History Encounter **       Social Determinants of Health   Financial Resource Strain: Not on file  Food Insecurity: No Food Insecurity (10/03/2022)   Hunger Vital Sign    Worried About Running Out of Food in the  Last Year: Never true    Ran Out of Food in the Last Year: Never true  Transportation Needs: No Transportation Needs (10/03/2022)   PRAPARE - Administrator, Civil Service (Medical): No    Lack of Transportation (Non-Medical): No  Physical Activity: Not on file  Stress: Not on file  Social Connections: Unknown (01/08/2022)   Received from [facility]   Social Network    Social Network: Not on file  Intimate Partner Violence: Not At Risk (10/03/2022)   Humiliation, Afraid, Rape, and Kick questionnaire    Fear of Current or Ex-Partner: No    Emotionally Abused: No    Physically Abused: No    Sexually Abused: No    Family History  Problem Relation Age of Onset   Diabetes Father    Cancer Maternal Aunt        breast   Diabetes Paternal Aunt    Diabetes Paternal Uncle      Current Outpatient Medications:    acetaminophen (TYLENOL) 500 MG tablet, Take by mouth., Disp: , Rfl:    cetirizine (ZYRTEC) 10 MG chewable tablet, Chew 10 mg by mouth daily., Disp: , Rfl:    Prenatal Vit-Fe Fumarate-FA (MULTIVITAMIN-PRENATAL) 27-0.8 MG TABS tablet, Take 1 tablet by mouth daily at 12 noon., Disp: , Rfl:   Physical exam:  Vitals:   04/30/23 0947  BP: 124/73  Pulse: 76  Resp: (!) 118  Temp: (!) 96.8 F (36 C)  TempSrc: Tympanic  SpO2: 100%  Weight: 147 lb 11.2 oz (67 kg)  Height: 5\' 6"  (1.676 m)   Physical Exam Cardiovascular:     Rate and Rhythm: Normal rate and regular rhythm.     Heart sounds: Normal heart sounds.  Pulmonary:     Effort: Pulmonary effort is normal.  Abdominal:     Comments: Gravid uterus  Skin:    General: Skin is warm and dry.  Neurological:     Mental Status: She is alert and oriented to person, place, and time.         Latest Ref Rng & Units 09/27/2022    9:39 AM  CMP  Glucose 70 - 99 mg/dL 74   BUN 6 - 20 mg/dL 11   Creatinine 1.61 - 1.00 mg/dL 0.96   Sodium 045 - 409 mmol/L 140   Potassium 3.5 - 5.2 mmol/L 4.1   Chloride 96 - 106  mmol/L 104   CO2 20 - 29 mmol/L 21   Calcium 8.7 - 10.2 mg/dL 9.1   Total Protein 6.0 - 8.5 g/dL 7.2   Total Bilirubin 0.0 - 1.2 mg/dL 0.4   Alkaline Phos 44 - 121 IU/L 52   AST 0 - 40 IU/L 20   ALT 0 - 32 IU/L 15       Latest Ref Rng & Units 04/30/2023    9:18 AM  CBC  WBC 4.0 - 10.5 K/uL 6.1   Hemoglobin 12.0 - 15.0 g/dL 81.1   Hematocrit 91.4 - 46.0 % 32.1   Platelets 150 - 400 K/uL 165  Assessment and plan- Patient is a 27 y.o. female here for follow-up of following issues  Benign ethnic neutropenia: Today her white count and ANC are normal.  Continue to monitor  Normocytic anemia: Her hemoglobin which was previously normal is now down to 10.9.  Iron studies are indicative of iron deficiency along with mildly low B12 levels less than 300 as well.  I recommend 5 doses of Venofer 200 mg IV given over the next 3 weeks.  Discussed risks and benefits of Venofer including all but not limited to possible risk of infusion reactions.  Patient understands and agrees to proceed as planned.  We will also plan to give her 3 doses of B12 along with IV iron.I will repeat CT C ferritin and iron studies as well as B12 levels in 2 months and see her thereafter   Visit Diagnosis 1. Other neutropenia (HCC)   2. Anemia affecting pregnancy in third trimester      Dr. Owens Shark, MD, MPH [facility] at [facility] [phone] 04/30/2023 4:17 PM

## 2023-05-03 ENCOUNTER — Inpatient Hospital Stay: Payer: Medicaid Other

## 2023-05-03 VITALS — BP 100/60 | HR 73 | Temp 97.3°F | Resp 73

## 2023-05-03 DIAGNOSIS — O99013 Anemia complicating pregnancy, third trimester: Secondary | ICD-10-CM | POA: Diagnosis not present

## 2023-05-03 MED ORDER — CYANOCOBALAMIN 1000 MCG/ML IJ SOLN
1000.0000 ug | Freq: Once | INTRAMUSCULAR | Status: AC
  Administered 2023-05-03: 1000 ug via INTRAMUSCULAR
  Filled 2023-05-03: qty 1

## 2023-05-03 MED ORDER — SODIUM CHLORIDE 0.9 % IV SOLN
Freq: Once | INTRAVENOUS | Status: AC
  Filled 2023-05-03: qty 250

## 2023-05-03 MED ORDER — SODIUM CHLORIDE 0.9 % IV SOLN
200.0000 mg | INTRAVENOUS | Status: DC
  Administered 2023-05-03: 200 mg via INTRAVENOUS
  Filled 2023-05-03: qty 200

## 2023-05-07 ENCOUNTER — Inpatient Hospital Stay: Payer: Medicaid Other

## 2023-05-07 VITALS — BP 123/78 | HR 92 | Temp 97.6°F

## 2023-05-07 DIAGNOSIS — O99013 Anemia complicating pregnancy, third trimester: Secondary | ICD-10-CM | POA: Diagnosis not present

## 2023-05-07 MED ORDER — SODIUM CHLORIDE 0.9 % IV SOLN
Freq: Once | INTRAVENOUS | Status: AC
  Filled 2023-05-07: qty 250

## 2023-05-07 MED ORDER — CYANOCOBALAMIN 1000 MCG/ML IJ SOLN
1000.0000 ug | Freq: Once | INTRAMUSCULAR | Status: AC
  Administered 2023-05-07: 1000 ug via INTRAMUSCULAR
  Filled 2023-05-07: qty 1

## 2023-05-07 MED ORDER — SODIUM CHLORIDE 0.9 % IV SOLN
200.0000 mg | INTRAVENOUS | Status: DC
  Administered 2023-05-07: 200 mg via INTRAVENOUS
  Filled 2023-05-07: qty 200

## 2023-05-07 NOTE — Patient Instructions (Signed)
Iron Sucrose Injection What is this medication? IRON SUCROSE (EYE ern SOO krose) treats low levels of iron (iron deficiency anemia) in people with kidney disease. Iron is a mineral that plays an important role in making red blood cells, which carry oxygen from your lungs to the rest of your body. This medicine may be used for other purposes; ask your health care provider or pharmacist if you have questions. COMMON BRAND NAME(S): Venofer What should I tell my care team before I take this medication? They need to know if you have any of these conditions: Anemia not caused by low iron levels Heart disease High levels of iron in the blood Kidney disease Liver disease An unusual or allergic reaction to iron, other medications, foods, dyes, or preservatives Pregnant or trying to get pregnant Breastfeeding How should I use this medication? This medication is for infusion into a vein. It is given in a hospital or clinic setting. Talk to your care team about the use of this medication in children. While this medication may be prescribed for children as young as 2 years for selected conditions, precautions do apply. Overdosage: If you think you have taken too much of this medicine contact a poison control center or emergency room at once. NOTE: This medicine is only for you. Do not share this medicine with others. What if I miss a dose? Keep appointments for follow-up doses. It is important not to miss your dose. Call your care team if you are unable to keep an appointment. What may interact with this medication? Do not take this medication with any of the following: Deferoxamine Dimercaprol Other iron products This medication may also interact with the following: Chloramphenicol Deferasirox This list may not describe all possible interactions. Give your health care provider a list of all the medicines, herbs, non-prescription drugs, or dietary supplements you use. Also tell them if you smoke,  drink alcohol, or use illegal drugs. Some items may interact with your medicine. What should I watch for while using this medication? Visit your care team regularly. Tell your care team if your symptoms do not start to get better or if they get worse. You may need blood work done while you are taking this medication. You may need to follow a special diet. Talk to your care team. Foods that contain iron include: whole grains/cereals, dried fruits, beans, or peas, leafy green vegetables, and organ meats (liver, kidney). What side effects may I notice from receiving this medication? Side effects that you should report to your care team as soon as possible: Allergic reactions--skin rash, itching, hives, swelling of the face, lips, tongue, or throat Low blood pressure--dizziness, feeling faint or lightheaded, blurry vision Shortness of breath Side effects that usually do not require medical attention (report to your care team if they continue or are bothersome): Flushing Headache Joint pain Muscle pain Nausea Pain, redness, or irritation at injection site This list may not describe all possible side effects. Call your doctor for medical advice about side effects. You may report side effects to FDA at 1-800-FDA-1088. Where should I keep my medication? This medication is given in a hospital or clinic. It will not be stored at home. NOTE: This sheet is a summary. It may not cover all possible information. If you have questions about this medicine, talk to your doctor, pharmacist, or health care provider.  2024 Elsevier/Gold Standard (2023-01-18 00:00:00)  Vitamin B12 Injection What is this medication? Vitamin B12 (VAHY tuh min B12) prevents and treats low  vitamin B12 levels in your body. It is used in people who do not get enough vitamin B12 from their diet or when their digestive tract does not absorb enough. Vitamin B12 plays an important role in maintaining the health of your nervous system and  red blood cells. This medicine may be used for other purposes; ask your health care provider or pharmacist if you have questions. COMMON BRAND NAME(S): B-12 Compliance Kit, B-12 Injection Kit, Cyomin, Dodex, LA-12, Nutri-Twelve, Physicians EZ Use B-12, Primabalt, Vitamin Deficiency Injectable System - B12 What should I tell my care team before I take this medication? They need to know if you have any of these conditions: Kidney disease Leber's disease Megaloblastic anemia An unusual or allergic reaction to cyanocobalamin, cobalt, other medications, foods, dyes, or preservatives Pregnant or trying to get pregnant Breast-feeding How should I use this medication? This medication is injected into a muscle or deeply under the skin. It is usually given in a clinic or care team's office. However, your care team may teach you how to inject yourself. Follow all instructions. Talk to your care team about the use of this medication in children. Special care may be needed. Overdosage: If you think you have taken too much of this medicine contact a poison control center or emergency room at once. NOTE: This medicine is only for you. Do not share this medicine with others. What if I miss a dose? If you are given your dose at a clinic or care team's office, call to reschedule your appointment. If you give your own injections, and you miss a dose, take it as soon as you can. If it is almost time for your next dose, take only that dose. Do not take double or extra doses. What may interact with this medication? Alcohol Colchicine This list may not describe all possible interactions. Give your health care provider a list of all the medicines, herbs, non-prescription drugs, or dietary supplements you use. Also tell them if you smoke, drink alcohol, or use illegal drugs. Some items may interact with your medicine. What should I watch for while using this medication? Visit your care team regularly. You may need  blood work done while you are taking this medication. You may need to follow a special diet. Talk to your care team. Limit your alcohol intake and avoid smoking to get the best benefit. What side effects may I notice from receiving this medication? Side effects that you should report to your care team as soon as possible: Allergic reactions--skin rash, itching, hives, swelling of the face, lips, tongue, or throat Swelling of the ankles, hands, or feet Trouble breathing Side effects that usually do not require medical attention (report to your care team if they continue or are bothersome): Diarrhea This list may not describe all possible side effects. Call your doctor for medical advice about side effects. You may report side effects to FDA at 1-800-FDA-1088. Where should I keep my medication? Keep out of the reach of children. Store at room temperature between 15 and 30 degrees C (59 and 85 degrees F). Protect from light. Throw away any unused medication after the expiration date. NOTE: This sheet is a summary. It may not cover all possible information. If you have questions about this medicine, talk to your doctor, pharmacist, or health care provider.  2024 Elsevier/Gold Standard (2021-04-25 00:00:00)

## 2023-05-08 ENCOUNTER — Ambulatory Visit: Payer: Medicaid Other | Admitting: Physical Therapy

## 2023-05-13 ENCOUNTER — Inpatient Hospital Stay: Payer: Medicaid Other

## 2023-05-15 ENCOUNTER — Ambulatory Visit: Payer: Medicaid Other | Admitting: Physical Therapy

## 2023-05-15 ENCOUNTER — Inpatient Hospital Stay: Payer: Medicaid Other

## 2023-05-15 VITALS — BP 108/61 | HR 66 | Temp 97.2°F | Resp 18

## 2023-05-15 DIAGNOSIS — O99013 Anemia complicating pregnancy, third trimester: Secondary | ICD-10-CM | POA: Diagnosis not present

## 2023-05-15 MED ORDER — CYANOCOBALAMIN 1000 MCG/ML IJ SOLN
1000.0000 ug | Freq: Once | INTRAMUSCULAR | Status: AC
  Administered 2023-05-15: 1000 ug via INTRAMUSCULAR
  Filled 2023-05-15: qty 1

## 2023-05-15 MED ORDER — SODIUM CHLORIDE 0.9 % IV SOLN
Freq: Once | INTRAVENOUS | Status: AC
  Filled 2023-05-15: qty 250

## 2023-05-15 MED ORDER — SODIUM CHLORIDE 0.9 % IV SOLN
200.0000 mg | INTRAVENOUS | Status: DC
  Administered 2023-05-15: 200 mg via INTRAVENOUS
  Filled 2023-05-15: qty 200

## 2023-05-15 NOTE — Patient Instructions (Signed)
Iron Sucrose Injection What is this medication? IRON SUCROSE (EYE ern SOO krose) treats low levels of iron (iron deficiency anemia) in people with kidney disease. Iron is a mineral that plays an important role in making red blood cells, which carry oxygen from your lungs to the rest of your body. This medicine may be used for other purposes; ask your health care provider or pharmacist if you have questions. COMMON BRAND NAME(S): Venofer What should I tell my care team before I take this medication? They need to know if you have any of these conditions: Anemia not caused by low iron levels Heart disease High levels of iron in the blood Kidney disease Liver disease An unusual or allergic reaction to iron, other medications, foods, dyes, or preservatives Pregnant or trying to get pregnant Breastfeeding How should I use this medication? This medication is for infusion into a vein. It is given in a hospital or clinic setting. Talk to your care team about the use of this medication in children. While this medication may be prescribed for children as young as 2 years for selected conditions, precautions do apply. Overdosage: If you think you have taken too much of this medicine contact a poison control center or emergency room at once. NOTE: This medicine is only for you. Do not share this medicine with others. What if I miss a dose? Keep appointments for follow-up doses. It is important not to miss your dose. Call your care team if you are unable to keep an appointment. What may interact with this medication? Do not take this medication with any of the following: Deferoxamine Dimercaprol Other iron products This medication may also interact with the following: Chloramphenicol Deferasirox This list may not describe all possible interactions. Give your health care provider a list of all the medicines, herbs, non-prescription drugs, or dietary supplements you use. Also tell them if you smoke,  drink alcohol, or use illegal drugs. Some items may interact with your medicine. What should I watch for while using this medication? Visit your care team regularly. Tell your care team if your symptoms do not start to get better or if they get worse. You may need blood work done while you are taking this medication. You may need to follow a special diet. Talk to your care team. Foods that contain iron include: whole grains/cereals, dried fruits, beans, or peas, leafy green vegetables, and organ meats (liver, kidney). What side effects may I notice from receiving this medication? Side effects that you should report to your care team as soon as possible: Allergic reactions--skin rash, itching, hives, swelling of the face, lips, tongue, or throat Low blood pressure--dizziness, feeling faint or lightheaded, blurry vision Shortness of breath Side effects that usually do not require medical attention (report to your care team if they continue or are bothersome): Flushing Headache Joint pain Muscle pain Nausea Pain, redness, or irritation at injection site This list may not describe all possible side effects. Call your doctor for medical advice about side effects. You may report side effects to FDA at 1-800-FDA-1088. Where should I keep my medication? This medication is given in a hospital or clinic. It will not be stored at home. NOTE: This sheet is a summary. It may not cover all possible information. If you have questions about this medicine, talk to your doctor, pharmacist, or health care provider.  2024 Elsevier/Gold Standard (2023-01-18 00:00:00)

## 2023-05-17 ENCOUNTER — Ambulatory Visit (INDEPENDENT_AMBULATORY_CARE_PROVIDER_SITE_OTHER): Payer: Medicaid Other | Admitting: Family Medicine

## 2023-05-17 ENCOUNTER — Inpatient Hospital Stay: Payer: Medicaid Other

## 2023-05-17 VITALS — BP 104/65 | HR 78 | Wt 147.6 lb

## 2023-05-17 VITALS — BP 106/58 | HR 68 | Temp 95.1°F | Resp 18

## 2023-05-17 DIAGNOSIS — Z148 Genetic carrier of other disease: Secondary | ICD-10-CM

## 2023-05-17 DIAGNOSIS — O99013 Anemia complicating pregnancy, third trimester: Secondary | ICD-10-CM | POA: Diagnosis not present

## 2023-05-17 DIAGNOSIS — Z3480 Encounter for supervision of other normal pregnancy, unspecified trimester: Secondary | ICD-10-CM

## 2023-05-17 MED ORDER — SODIUM CHLORIDE 0.9 % IV SOLN
Freq: Once | INTRAVENOUS | Status: AC
  Filled 2023-05-17: qty 250

## 2023-05-17 MED ORDER — SODIUM CHLORIDE 0.9 % IV SOLN
200.0000 mg | INTRAVENOUS | Status: DC
  Administered 2023-05-17: 200 mg via INTRAVENOUS
  Filled 2023-05-17: qty 200

## 2023-05-17 NOTE — Progress Notes (Signed)
ROB- doing well.

## 2023-05-17 NOTE — Patient Instructions (Signed)

## 2023-05-17 NOTE — Progress Notes (Signed)
   PRENATAL VISIT NOTE  Subjective:  Emily Flowers is a 27 y.o. G2P1001 at [redacted]w[redacted]d being seen today for ongoing prenatal care.  She is currently monitored for the following issues for this low-risk pregnancy and has Supervision of other normal pregnancy, antepartum; Carrier of spinal muscular atrophy; and Anemia affecting pregnancy in third trimester on their problem list.  Patient reports no complaints.  Contractions: Not present. Vag. Bleeding: None.  Movement: Present. Denies leaking of fluid.   The following portions of the patient's history were reviewed and updated as appropriate: allergies, current medications, past family history, past medical history, past social history, past surgical history and problem list.   Objective:   Vitals:   05/17/23 0840  BP: 104/65  Pulse: 78  Weight: 147 lb 9.6 oz (67 kg)    Fetal Status: Fetal Heart Rate (bpm): 152 Fundal Height: 29 cm Movement: Present     General:  Alert, oriented and cooperative. Patient is in no acute distress.  Skin: Skin is warm and dry. No rash noted.   Cardiovascular: Normal heart rate noted  Respiratory: Normal respiratory effort, no problems with respiration noted  Abdomen: Soft, gravid, appropriate for gestational age.  Pain/Pressure: Present     Pelvic: Cervical exam deferred        Extremities: Normal range of motion.  Edema: None  Mental Status: Normal mood and affect. Normal behavior. Normal judgment and thought content.   Assessment and Plan:  Pregnancy: G2P1001 at [redacted]w[redacted]d 1. Anemia affecting pregnancy in third trimester Lab Results  Component Value Date   HGB 10.4 (L) 04/30/2023   HGB 10.5 (L) 04/17/2023   HGB 10.5 (L) 04/17/2023  Stable, encourage Fe rich foods and PNV    2. Carrier of spinal muscular atrophy  3. Supervision of other normal pregnancy, antepartum Signed up for WB class in October Discussed some logistics of WB Patient's last labor was 6hr of active labor. No concerns today    Preterm labor symptoms and general obstetric precautions including but not limited to vaginal bleeding, contractions, leaking of fluid and fetal movement were reviewed in detail with the patient. Please refer to After Visit Summary for other counseling recommendations.   Return in about 2 weeks (around 05/31/2023) for Routine prenatal care.  Future Appointments  Date Time Provider Department Center  05/17/2023 10:00 AM [facility] INFUSION CHAIR 17 [facility] None  05/21/2023 11:45 AM [facility]FUS[facility] CHAIR 5 [facility] None  05/30/2023  8:35 AM Anyanwu, Jethro Bastos, MD [facility]  06/13/2023  8:35 AM Federico Flake, MD [facility]  06/27/2023  8:35 AM [place] Bing, MD [facility]  07/01/2023  9:30 AM [facility] None  07/03/2023  9:45 AM Creig Hines, MD [facility] None  07/05/2023  8:35 AM Federico Flake, MD [facility]  07/11/2023  8:55 AM [place] Large, Jethro Bastos, MD [facility]  07/18/2023  8:35 AM [place] Bing, MD [facility]    Federico Flake, MD

## 2023-05-21 ENCOUNTER — Inpatient Hospital Stay: Payer: Medicaid Other

## 2023-05-21 VITALS — BP 108/68 | HR 79 | Temp 96.9°F | Resp 18

## 2023-05-21 DIAGNOSIS — O99013 Anemia complicating pregnancy, third trimester: Secondary | ICD-10-CM | POA: Diagnosis not present

## 2023-05-21 MED ORDER — SODIUM CHLORIDE 0.9 % IV SOLN
200.0000 mg | INTRAVENOUS | Status: DC
  Administered 2023-05-21: 200 mg via INTRAVENOUS
  Filled 2023-05-21: qty 200

## 2023-05-21 MED ORDER — SODIUM CHLORIDE 0.9 % IV SOLN
Freq: Once | INTRAVENOUS | Status: AC
  Filled 2023-05-21: qty 250

## 2023-05-22 ENCOUNTER — Ambulatory Visit: Payer: Medicaid Other | Admitting: Physical Therapy

## 2023-05-28 ENCOUNTER — Encounter: Payer: Self-pay | Admitting: Obstetrics & Gynecology

## 2023-05-29 ENCOUNTER — Encounter: Payer: Medicaid Other | Admitting: Physical Therapy

## 2023-05-30 ENCOUNTER — Encounter: Payer: Self-pay | Admitting: Obstetrics & Gynecology

## 2023-05-30 ENCOUNTER — Ambulatory Visit: Payer: Medicaid Other | Admitting: Obstetrics & Gynecology

## 2023-05-30 VITALS — BP 95/60 | HR 69 | Wt 148.0 lb

## 2023-05-30 DIAGNOSIS — Z3480 Encounter for supervision of other normal pregnancy, unspecified trimester: Secondary | ICD-10-CM

## 2023-05-30 DIAGNOSIS — Z3A32 32 weeks gestation of pregnancy: Secondary | ICD-10-CM

## 2023-05-30 DIAGNOSIS — R0602 Shortness of breath: Secondary | ICD-10-CM

## 2023-05-30 NOTE — Progress Notes (Signed)
   PRENATAL VISIT NOTE  Subjective:  Emily Flowers is a 27 y.o. G2P1001 at [redacted]w[redacted]d being seen today for ongoing prenatal care.  Here with her husband. She is currently monitored for the following issues for this low-risk pregnancy and has Supervision of other normal pregnancy, antepartum; Carrier of spinal muscular atrophy; and Anemia affecting pregnancy in third trimester on their problem list.  Patient reports  worsening SOB on exertion and some chest pressure on exertion.  No chest pain.  Only happens on exertion.  Worried about her heart .  Contractions: Irregular. Vag. Bleeding: None.  Movement: Present. Denies leaking of fluid.   The following portions of the patient's history were reviewed and updated as appropriate: allergies, current medications, past family history, past medical history, past social history, past surgical history and problem list.   Objective:   Vitals:   05/30/23 0831  BP: 95/60  Pulse: 69  Weight: 148 lb (67.1 kg)    Fetal Status: Fetal Heart Rate (bpm): 139 Fundal Height: 32 cm Movement: Present     General:  Alert, oriented and cooperative. Patient is in no acute distress.  Skin: Skin is warm and dry. No rash noted.   Cardiovascular: Normal heart rate noted  Respiratory: Normal respiratory effort, no problems with respiration noted  Abdomen: Soft, gravid, appropriate for gestational age.  Pain/Pressure: Present     Pelvic: Cervical exam deferred        Extremities: Normal range of motion.  Edema: None  Mental Status: Normal mood and affect. Normal behavior. Normal judgment and thought content.   Assessment and Plan:  Pregnancy: G2P1001 at [redacted]w[redacted]d 1. SOB (shortness of breath) and chest pressure on exertion in pregnancy Referred to Cardio OB for further evaluation, advised to go to [facility] for any worsening symptoms. - AMB Referral to Cardio Obstetrics  2. [redacted] weeks gestation of pregnancy 3. Supervision of other normal pregnancy, antepartum Having  water birth class tonight.  Preterm labor symptoms and general obstetric precautions including but not limited to vaginal bleeding, contractions, leaking of fluid and fetal movement were reviewed in detail with the patient. Please refer to After Visit Summary for other counseling recommendations.   Return in about 2 weeks (around 06/13/2023) for OFFICE OB VISIT (MD or APP).  Future Appointments  Date Time Provider Department Center  06/13/2023  8:35 AM Federico Flake, MD [facility]  06/27/2023  8:35 AM [place] Bing, MD [facility]  07/01/2023  9:30 AM [facility] None  07/03/2023  9:45 AM Creig Hines, MD [facility] None  07/05/2023  8:35 AM Federico Flake, MD [facility]  07/11/2023  8:55 AM [place] Large, Jethro Bastos, MD [facility]  07/18/2023  8:35 AM [place] Bing, MD [facility]    Jaynie Collins, MD

## 2023-05-30 NOTE — Patient Instructions (Signed)

## 2023-06-05 ENCOUNTER — Encounter: Payer: Medicaid Other | Admitting: Physical Therapy

## 2023-06-11 ENCOUNTER — Ambulatory Visit: Payer: Medicaid Other | Admitting: Cardiology

## 2023-06-12 ENCOUNTER — Encounter: Payer: Medicaid Other | Admitting: Physical Therapy

## 2023-06-13 ENCOUNTER — Ambulatory Visit (INDEPENDENT_AMBULATORY_CARE_PROVIDER_SITE_OTHER): Payer: Medicaid Other | Admitting: Family Medicine

## 2023-06-13 ENCOUNTER — Inpatient Hospital Stay (HOSPITAL_COMMUNITY)
Admission: AD | Admit: 2023-06-13 | Discharge: 2023-06-14 | Disposition: A | Discharge status: Home / Self Care | Payer: Medicaid Other | Attending: Obstetrics and Gynecology | Admitting: Obstetrics and Gynecology

## 2023-06-13 ENCOUNTER — Encounter (HOSPITAL_COMMUNITY): Payer: Self-pay | Admitting: Obstetrics and Gynecology

## 2023-06-13 VITALS — BP 113/77 | HR 71 | Wt 150.0 lb

## 2023-06-13 DIAGNOSIS — Z3689 Encounter for other specified antenatal screening: Secondary | ICD-10-CM | POA: Insufficient documentation

## 2023-06-13 DIAGNOSIS — Z3A34 34 weeks gestation of pregnancy: Secondary | ICD-10-CM | POA: Insufficient documentation

## 2023-06-13 DIAGNOSIS — Z3480 Encounter for supervision of other normal pregnancy, unspecified trimester: Secondary | ICD-10-CM

## 2023-06-13 DIAGNOSIS — O99013 Anemia complicating pregnancy, third trimester: Secondary | ICD-10-CM

## 2023-06-13 DIAGNOSIS — Z148 Genetic carrier of other disease: Secondary | ICD-10-CM

## 2023-06-13 DIAGNOSIS — Z0371 Encounter for suspected problem with amniotic cavity and membrane ruled out: Secondary | ICD-10-CM

## 2023-06-13 DIAGNOSIS — O368130 Decreased fetal movements, third trimester, not applicable or unspecified: Secondary | ICD-10-CM | POA: Diagnosis not present

## 2023-06-13 LAB — URINALYSIS, ROUTINE W REFLEX MICROSCOPIC
Bilirubin Urine: NEGATIVE
Glucose, UA: NEGATIVE mg/dL
Hgb urine dipstick: NEGATIVE
Ketones, ur: NEGATIVE mg/dL
Nitrite: NEGATIVE
Protein, ur: NEGATIVE mg/dL
Specific Gravity, Urine: 1.011 (ref 1.005–1.030)
pH: 7 (ref 5.0–8.0)

## 2023-06-13 LAB — WET PREP, GENITAL
Clue Cells Wet Prep HPF POC: NONE SEEN
Sperm: NONE SEEN
Trich, Wet Prep: NONE SEEN
WBC, Wet Prep HPF POC: >=10 — AB (ref <10)
Yeast Wet Prep HPF POC: NONE SEEN

## 2023-06-13 LAB — RUPTURE OF MEMBRANE (ROM)PLUS: Rom Plus: NEGATIVE

## 2023-06-13 LAB — POCT FERN TEST: POCT Fern Test: NEGATIVE

## 2023-06-13 NOTE — Progress Notes (Signed)
PRENATAL VISIT NOTE  Subjective:  Emily Flowers is a 27 y.o. G2P1001 at [redacted]w[redacted]d being seen today for ongoing prenatal care.  She is currently monitored for the following issues for this low-risk pregnancy and has Supervision of other normal pregnancy, antepartum; Carrier of spinal muscular atrophy; and Anemia affecting pregnancy in third trimester on their problem list.  Patient reports no complaints.  Contractions: Irritability. Vag. Bleeding: None.  Movement: Present. Denies leaking of fluid.   The following portions of the patient's history were reviewed and updated as appropriate: allergies, current medications, past family history, past medical history, past social history, past surgical history and problem list.   Objective:   Vitals:   06/13/23 0927  BP: 113/77  Pulse: 71  Weight: 150 lb (68 kg)    Fetal Status: Fetal Heart Rate (bpm): 136   Movement: Present     General:  Alert, oriented and cooperative. Patient is in no acute distress.  Skin: Skin is warm and dry. No rash noted.   Cardiovascular: Normal heart rate noted  Respiratory: Normal respiratory effort, no problems with respiration noted  Abdomen: Soft, gravid, appropriate for gestational age.  Pain/Pressure: Present     Pelvic: Cervical exam deferred        Extremities: Normal range of motion.     Mental Status: Normal mood and affect. Normal behavior. Normal judgment and thought content.   Assessment and Plan:  Pregnancy: G2P1001 at [redacted]w[redacted]d 1. Anemia affecting pregnancy in third trimester Lab Results  Component Value Date   HGB 10.4 (L) 04/30/2023   HGB 10.5 (L) 04/17/2023   HGB 10.5 (L) 04/17/2023  Stable. Encouraged prenatal vitamin and high iron foods   2. Supervision of other normal pregnancy, antepartum Up to date Having hip and pelvic pain Vigorous movement Attended WB class and will send certificate Reviewed some of logistics of hosptial WB vs her prior experience with home WB  - Pt  interested in waterbirth and has attended the class.  - Reviewed conditions in labor that will risk her out of water immersion including thick meconium or blood stained amniotic fluid, non-reassuring fetal status on monitor, excessive bleeding, hypertension, dizziness, use of IV meds, damaged equipment or staffing that does not allow for water immersion, etc.  - The attending midwife must be on the unit for water immersion to begin; pt understands this may delay the start of water immersion. - Reminded pt that signing consent in labor at the hospital also acknowledges they will exit the tub if the attending midwife requests. - Consent given to patient for review.  Consent will be reviewed and signed at the hospital by the waterbirth provider prior to use of the tub. - Discussed other labor support options if waterbirth becomes unavailable, including position change, freedom of movement, use of birthing ball, and/or use of hydrotherapy in the shower (dependent upon medical condition/provider discretion).    3. Carrier of spinal muscular atrophy  Preterm labor symptoms and general obstetric precautions including but not limited to vaginal bleeding, contractions, leaking of fluid and fetal movement were reviewed in detail with the patient. Please refer to After Visit Summary for other counseling recommendations.   Return in about 2 weeks (around 06/27/2023) for Routine prenatal care, 36wks.  Future Appointments  Date Time Provider Department Center  06/21/2023  3:00 PM Thomasene Ripple, DO [facility] None  06/27/2023  8:35 AM [place] Bing, MD [facility]  07/01/2023  9:30 AM [facility] None  07/03/2023  9:45 AM  Creig Hines, MD [facility] None  07/05/2023  8:35 AM Federico Flake, MD [facility]  07/11/2023  8:55 AM [place] Large, Jethro Bastos, MD [facility]  07/18/2023  8:35 AM [place] Bing, MD [facility]    Federico Flake, MD

## 2023-06-13 NOTE — MAU Provider Note (Signed)
Chief Complaint:  Decreased Fetal Movement   Event Date/Time   First Provider Initiated Contact with Patient 06/13/23 2149      HPI: Emily Flowers is a 27 y.o. G2P1001 at [redacted]w[redacted]d by early ultrasound who presents to maternity admissions reporting decreased fetal movement today and an episode at home with a gush of fluid that was enough for a pad but would not have fully soaked a pad. There is a small trickle of fluid now that would only require a pantyliner.  She is feeling more fetal movement in MAU.    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   Urge incontinence 10/04/2022    Past obstetric history: OB History  Gravida Para Term Preterm AB Living  2 1 1     1   SAB IAB Ectopic Multiple Live Births          1    # Outcome Date GA Lbr Len/2nd Weight Sex Type Anes PTL Lv  2 Current           1 Term 10/02/21    F Vag-Spont None      Past Surgical History: Past Surgical History:  Procedure Laterality Date   TONSILLECTOMY Bilateral 04/08/2020   Procedure: TONSILLECTOMY;  Surgeon: Linus Salmons, MD;  Location: [facility];  Service: ENT;  Laterality: Bilateral;   WISDOM TOOTH EXTRACTION      Family History: Family History  Problem Relation Age of Onset   Diabetes Father    Cancer Maternal Aunt        breast   Diabetes Paternal Aunt    Diabetes Paternal Uncle     Social History: Social History   Tobacco Use   Smoking status: Never    Passive exposure: Never   Smokeless tobacco: Never  Vaping Use   Vaping status: Never Used  Substance Use Topics   Alcohol use: No    Alcohol/week: 0.0 standard drinks of alcohol   Drug use: No    Allergies:  Allergies  Allergen Reactions   Sulfa Antibiotics      Unknown childhood reaction    Meds:  No medications prior to admission.    ROS:  Review of Systems  Constitutional:  Negative for chills, fatigue and fever.  Eyes:  Negative for visual disturbance.  Respiratory:  Negative for shortness of breath.   Cardiovascular:  Negative for chest pain.  Gastrointestinal:  Positive for abdominal pain. Negative for nausea and vomiting.  Genitourinary:  Positive for vaginal discharge. Negative for difficulty urinating, dysuria, flank pain, pelvic pain, vaginal bleeding and vaginal pain.  Neurological:  Negative for dizziness and headaches.  Psychiatric/Behavioral: Negative.       I have reviewed patient's Past Medical Hx, Surgical Hx, Family Hx, Social Hx, medications and allergies.   Physical Exam  Patient Vitals for the past 24 hrs:  BP Temp Temp src Pulse Resp SpO2 Height Weight  06/14/23 0020 (!) 106/47 -- -- 62 -- -- -- --  06/14/23 0017 (!) 106/47 -- -- -- -- -- -- --  06/13/23 2305 -- -- -- -- -- 98 % -- --  06/13/23 2200 -- -- -- -- -- 98 % -- --  06/13/23 2130 -- -- -- -- --  98 % -- --  06/13/23 2115 116/70 -- -- 68 -- 100 % -- --  06/13/23 2112 114/63 98.3 F (36.8 C) Oral 72 16 100 % -- --  06/13/23 2111 -- -- -- -- -- -- 5\' 6"  (1.676 m) 70.1 kg   Constitutional: Well-developed, well-nourished female in no acute distress.  Cardiovascular: normal rate Respiratory: normal effort GI: Abd soft, non-tender, gravid appropriate for gestational age.  MS: Extremities nontender, no edema, normal ROM Neurologic: Alert and oriented x 4.  GU: Neg CVAT.  PELVIC EXAM: Cervix pink, visually closed, without lesion, scant white creamy discharge, negative pooling, vaginal walls and external genitalia normal   Dilation: Closed Effacement (%): Thick Cervical Position: Posterior Exam by:: Sharen Counter, CNM  FHT:  Baseline 135 , moderate variability, accelerations present, no decelerations Contractions: q 4-10 mins   Labs: Results  for orders placed or performed during the hospital encounter of 06/13/23 (from the past 24 hour(s))  Urinalysis, Routine w reflex microscopic -Urine, Clean Catch     Status: Abnormal   Collection Time: 06/13/23  9:16 PM  Result Value Ref Range   Color, Urine YELLOW YELLOW   APPearance CLEAR CLEAR   Specific Gravity, Urine 1.011 1.005 - 1.030   pH 7.0 5.0 - 8.0   Glucose, UA NEGATIVE NEGATIVE mg/dL   Hgb urine dipstick NEGATIVE NEGATIVE   Bilirubin Urine NEGATIVE NEGATIVE   Ketones, ur NEGATIVE NEGATIVE mg/dL   Protein, ur NEGATIVE NEGATIVE mg/dL   Nitrite NEGATIVE NEGATIVE   Leukocytes,Ua MODERATE (A) NEGATIVE   RBC / HPF 0-5 0 - 5 RBC/hpf   WBC, UA 0-5 0 - 5 WBC/hpf   Bacteria, UA RARE (A) NONE SEEN   Squamous Epithelial / HPF 0-5 0 - 5 /HPF   Mucus PRESENT   Fern Test     Status: None   Collection Time: 06/13/23  9:29 PM  Result Value Ref Range   POCT Fern Test Negative = intact amniotic membranes   Wet prep, genital     Status: Abnormal   Collection Time: 06/13/23 10:21 PM  Result Value Ref Range   Yeast Wet Prep HPF POC NONE SEEN NONE SEEN   Trich, Wet Prep NONE SEEN NONE SEEN   Clue Cells Wet Prep HPF POC NONE SEEN NONE SEEN   WBC, Wet Prep HPF POC >=10 (A) <10   Sperm NONE SEEN   Rupture of Membrane (ROM) Plus     Status: None   Collection Time: 06/13/23 10:21 PM  Result Value Ref Range   Rom Plus NEGATIVE    O/Positive/-- (05/03 1137)  Imaging:  No results found.  MAU Course/MDM: Orders Placed This Encounter  Procedures   Wet prep, genital   Urinalysis, Routine w reflex microscopic -Urine, Clean Catch   Rupture of Membrane (ROM) Plus   Fern Test   Discharge patient    No orders of the defined types were placed in this encounter.    NST reviewed and reactive No evidence of ROM or active labor Normal fetal movement in MAU and reactive NST D/C home Keep scheduled prenatal appts Return to MAU as needed for emergencies  Assessment: 1. Encounter for  suspected premature rupture of amniotic membranes, with rupture of membranes not found   2. Decreased fetal movements in third trimester, single or unspecified fetus   3. [redacted] weeks gestation of pregnancy   4. NST (non-stress test) reactive     Plan: Discharge home Labor precautions and fetal kick counts  Allergies  as of 06/14/2023       Reactions   Sulfa Antibiotics    Unknown childhood reaction        Medication List     TAKE these medications    acetaminophen 500 MG tablet Commonly known as: TYLENOL Take by mouth.   cetirizine 10 MG chewable tablet Commonly known as: ZYRTEC Chew 10 mg by mouth daily.   multivitamin-prenatal 27-0.8 MG Tabs tablet Take 1 tablet by mouth daily at 12 noon.        Sharen Counter Certified Nurse-Midwife 06/14/2023 2:54 AM

## 2023-06-13 NOTE — MAU Note (Signed)
..  Emily Flowers is a 28 y.o. at [redacted]w[redacted]d here in MAU reporting: decreased fetal movement this evening, last movement was in the lobby.  Reports a gush of fluid when she sneezed around 1950, now continues to have some leaking but compares it to vaginal discharge.  Reports contractions for the past hour, has not been keeping track of how often.    FHT:135 Lab orders placed from triage: UA

## 2023-06-14 DIAGNOSIS — Z3A34 34 weeks gestation of pregnancy: Secondary | ICD-10-CM

## 2023-06-14 DIAGNOSIS — O368130 Decreased fetal movements, third trimester, not applicable or unspecified: Secondary | ICD-10-CM

## 2023-06-14 DIAGNOSIS — Z3689 Encounter for other specified antenatal screening: Secondary | ICD-10-CM | POA: Diagnosis not present

## 2023-06-14 NOTE — Discharge Instructions (Signed)
Reasons to return to MAU at [facility]:  Since you are preterm, return to MAU if:  1.  Contractions are 10 minutes apart or less and they becoming more uncomfortable or painful over time 2.  You have a large gush of fluid, or a trickle of fluid that will not stop and you have to wear a pad 3.  You have bleeding that is bright red, heavier than spotting--like menstrual bleeding (spotting can be normal in early labor or after a check of your cervix) 4.  You do not feel the baby moving like he/she normally does

## 2023-06-15 NOTE — Plan of Care (Signed)
[facility] Tonsillectomy/Adenoidectomy, Postoperative PEDS care plan entered in error.

## 2023-06-19 ENCOUNTER — Encounter: Payer: Medicaid Other | Admitting: Physical Therapy

## 2023-06-21 ENCOUNTER — Encounter: Payer: Self-pay | Admitting: Cardiology

## 2023-06-21 ENCOUNTER — Ambulatory Visit (INDEPENDENT_AMBULATORY_CARE_PROVIDER_SITE_OTHER): Payer: Medicaid Other | Admitting: Cardiology

## 2023-06-21 VITALS — BP 120/72 | HR 64 | Ht 66.0 in | Wt 154.0 lb

## 2023-06-21 DIAGNOSIS — R0609 Other forms of dyspnea: Secondary | ICD-10-CM

## 2023-06-21 DIAGNOSIS — R0789 Other chest pain: Secondary | ICD-10-CM | POA: Diagnosis not present

## 2023-06-21 NOTE — Patient Instructions (Addendum)
Medication Instructions:  Your physician recommends that you continue on your current medications as directed. Please refer to the Current Medication list given to you today.  *If you need a refill on your cardiac medications before your next appointment, please call your pharmacy*   Lab Work: None   Testing/Procedures: Your physician has requested that you have an echocardiogram OB. Echocardiography is a painless test that uses sound waves to create images of your heart. It provides your doctor with information about the size and shape of your heart and how well your heart's chambers and valves are working. This procedure takes approximately one hour. There are no restrictions for this procedure. Please do NOT wear cologne, perfume, aftershave, or lotions (deodorant is allowed). Please arrive 15 minutes prior to your appointment time.    Follow-Up: At [facility], you and your health needs are our priority.  As part of our continuing mission to provide you with exceptional heart care, we have created designated Provider Care Teams.  These Care Teams include your primary Cardiologist (physician) and Advanced Practice Providers (APPs -  Physician Assistants and Nurse Practitioners) who all work together to provide you with the care you need, when you need it.   Your next appointment:   12 week(s)  Provider:   Thomasene Ripple, DO [address], [place], [place] [address]

## 2023-06-21 NOTE — Progress Notes (Signed)
Cardio-Obstetrics Clinic  New Evaluation  Date:  06/21/2023   ID:  Emily Flowers, DOB [birth year], MRN [id number]  PCP:  Tamsen Roers, PA-C (Inactive)    HeartCare Providers Cardiologist:  None  Electrophysiologist:  None       Referring MD: Tereso Newcomer, MD   Chief Complaint: " I am short of breath"  History of Present Illness:    Emily Flowers is Flowers 27 y.o. female [G2P1001] who is being seen today for the evaluation of shortness of breath at the request of Emily, Ugonna A, MD.   She is currently 35 weeks in her second pregnancy, presents with symptoms of shortness of breath, rapid heart rate, and lightheadedness. These symptoms are particularly noticeable during physical activities such as climbing stairs or walking from the car to the house. The patient reports that these symptoms have been present since the first trimester, but have not been as severe recently.  The patient has Flowers history of Flowers benign heart murmur, which was diagnosed when she was Flowers teenager. At that time, she was experiencing heart palpitations, particularly during physical exertion as she was very athletic and ran track. She underwent Flowers thorough cardiac workup, including Flowers treadmill stress test, which confirmed the benign nature of the murmur.  The patient's first pregnancy was uncomplicated, and she had Flowers successful home birth. She did not experience any significant postpartum complications or bleeding. She is planning for Flowers spontaneous delivery for her current pregnancy.  She is her with her husband.  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   Subclinical hyperthyroidism 09/28/2022   Urge incontinence  10/04/2022    Past Surgical History:  Procedure Laterality Date   TONSILLECTOMY Bilateral 04/08/2020   Procedure: TONSILLECTOMY;  Surgeon: Linus Salmons, MD;  Location: [facility];  Service: ENT;  Laterality: Bilateral;   WISDOM TOOTH EXTRACTION        OB History     Gravida  2   Para  1   Term  1   Preterm      AB      Living  1      SAB      IAB      Ectopic      Multiple      Live Births  1               Current Medications: Current Meds  Medication Sig   acetaminophen (TYLENOL) 500 MG tablet Take by mouth.   cetirizine (ZYRTEC) 10 MG chewable tablet Chew 10 mg by mouth daily.   Prenatal Vit-Fe Fumarate-FA (MULTIVITAMIN-PRENATAL) 27-0.8 MG TABS tablet Take 1 tablet by mouth daily at 12 noon.     Allergies:   Sulfa antibiotics   Social History   Socioeconomic History   Marital status: Married    Spouse name: Not on file   Number of children: Not on file   Years of education: Not on file   Highest education level: Not on file  Occupational History   Not on file  Tobacco Use   Smoking status: Never    Passive exposure: Never   Smokeless tobacco: Never  Vaping Use   Vaping  status: Never Used  Substance and Sexual Activity   Alcohol use: No    Alcohol/week: 0.0 standard drinks of alcohol   Drug use: No   Sexual activity: Yes    Partners: Male    Birth control/protection: Condom  Other Topics Concern   Not on file  Social History Narrative   ** Merged History Encounter **       Social Determinants of Health   Financial Resource Strain: Not on file  Food Insecurity: No Food Insecurity (10/03/2022)   Hunger Vital Sign    Worried About Running Out of Food in the Last Year: Never true    Ran Out of Food in the Last Year: Never true  Transportation Needs: No Transportation Needs (10/03/2022)   PRAPARE - Administrator, Civil Service (Medical): No    Lack of Transportation (Non-Medical): No  Physical Activity: Not on  file  Stress: Not on file  Social Connections: Unknown (01/08/2022)   Received from [facility]   Social Network    Social Network: Not on file      Family History  Problem Relation Age of Onset   Diabetes Father    Cancer Maternal Aunt        breast   Diabetes Paternal Aunt    Diabetes Paternal Uncle       ROS:   Please see the history of present illness.    Dyspnea on exertion  All other systems reviewed and are negative.   Labs/EKG Reviewed:    EKG:   EKG was ordered today.  The ekg ordered today demonstrates sinus rhythm, hr 64 bpm  Recent Labs: 09/27/2022: ALT 15; BUN 11; Creatinine, Ser 0.78; Potassium 4.1; Sodium 140 12/28/2022: TSH 1.060 04/30/2023: Hemoglobin 10.4; Platelets 165   Recent Lipid Panel Lab Results  Component Value Date/Time   CHOL 151 11/27/2017 08:30 AM   TRIG 46 11/27/2017 08:30 AM   HDL 64 11/27/2017 08:30 AM   CHOLHDL 2.4 11/27/2017 08:30 AM   LDLCALC 74 11/27/2017 08:30 AM    Physical Exam:    VS:  BP 120/72 (BP Location: Left Arm)   Pulse 64   Ht 5\' 6"  (1.676 m)   Wt 154 lb (69.9 kg)   LMP 10/10/2022 (Exact Date)   SpO2 96%   BMI 24.86 kg/m     Wt Readings from Last 3 Encounters:  06/21/23 154 lb (69.9 kg)  06/13/23 154 lb 8 oz (70.1 kg)  06/13/23 150 lb (68 kg)     GEN:  Well nourished, well developed in no acute distress HEENT: Normal NECK: No JVD; No carotid bruits LYMPHATICS: No lymphadenopathy CARDIAC: RRR, no murmurs, rubs, gallops RESPIRATORY:  Clear to auscultation without rales, wheezing or rhonchi  ABDOMEN: Soft, non-tender, non-distended MUSCULOSKELETAL:  No edema; No deformity  SKIN: Warm and dry NEUROLOGIC:  Alert and oriented x 3 PSYCHIATRIC:  Normal affect    Risk Assessment/Risk Calculators:     CARPREG II Risk Prediction Index Score:  1.  The patient's risk for Flowers primary cardiac event is 5%.   Modified World Health Organization [facility]) Classification of Maternal CV Risk   Class I          ASSESSMENT & PLAN:    Dyspnea on exertion 35 weeks of gestation   -Schedule echocardiogram to reassess heart structure and function given new symptoms and time since last evaluation. -Follow-up postpartum to ensure symptoms resolve and are pregnancy-related.   Patient Instructions  Medication Instructions:  Your physician recommends that you continue on your current medications as directed. Please refer to the Current Medication list given to you today.  *If you need Flowers refill on your cardiac medications before your next appointment, please call your pharmacy*   Lab Work: None   Testing/Procedures: Your physician has requested that you have an echocardiogram OB. Echocardiography is Flowers painless test that uses sound waves to create images of your heart. It provides your doctor with information about the size and shape of your heart and how well your heart's chambers and valves are working. This procedure takes approximately one hour. There are no restrictions for this procedure. Please do NOT wear cologne, perfume, aftershave, or lotions (deodorant is allowed). Please arrive 15 minutes prior to your appointment time.    Follow-Up: At [facility], you and your health needs are our priority.  As part of our continuing mission to provide you with exceptional heart care, we have created designated Provider Care Teams.  These Care Teams include your primary Cardiologist (physician) and Advanced Practice Providers (APPs -  Physician Assistants and Nurse Practitioners) who all work together to provide you with the care you need, when you need it.   Your next appointment:   12 week(s)  Provider:   Thomasene Ripple, DO [address], [place], [place] [address]    Dispo:  No follow-ups on file.   Medication Adjustments/Labs and Tests Ordered: Current medicines are reviewed at length with the patient today.  Concerns regarding medicines are outlined above.  Tests  Ordered: Orders Placed This Encounter  Procedures   EKG 12-Lead   ECHOCARDIOGRAM COMPLETE   Medication Changes: No orders of the defined types were placed in this encounter.

## 2023-06-24 ENCOUNTER — Encounter: Payer: Self-pay | Admitting: Family Medicine

## 2023-06-27 ENCOUNTER — Ambulatory Visit: Payer: Medicaid Other | Admitting: Obstetrics and Gynecology

## 2023-06-27 ENCOUNTER — Other Ambulatory Visit (HOSPITAL_COMMUNITY)
Admission: RE | Admit: 2023-06-27 | Discharge: 2023-06-27 | Disposition: A | Discharge status: Home / Self Care | Payer: Medicaid Other | Source: Ambulatory Visit | Attending: Obstetrics and Gynecology | Admitting: Obstetrics and Gynecology

## 2023-06-27 VITALS — BP 101/69 | HR 69 | Wt 153.0 lb

## 2023-06-27 DIAGNOSIS — Z3480 Encounter for supervision of other normal pregnancy, unspecified trimester: Secondary | ICD-10-CM

## 2023-06-27 DIAGNOSIS — Z1339 Encounter for screening examination for other mental health and behavioral disorders: Secondary | ICD-10-CM

## 2023-06-27 DIAGNOSIS — Z3483 Encounter for supervision of other normal pregnancy, third trimester: Secondary | ICD-10-CM | POA: Diagnosis not present

## 2023-06-27 DIAGNOSIS — Z3A36 36 weeks gestation of pregnancy: Secondary | ICD-10-CM

## 2023-06-27 NOTE — Progress Notes (Signed)
   PRENATAL VISIT NOTE  Subjective:  Emily Flowers is a 27 y.o. G2P1001 at [redacted]w[redacted]d being seen today for ongoing prenatal care.  She is currently monitored for the following issues for this low-risk pregnancy and has Supervision of other normal pregnancy, antepartum; Carrier of spinal muscular atrophy; and Anemia affecting pregnancy in third trimester on their problem list.  Patient reports backache.  Contractions: Irregular. Vag. Bleeding: None.  Movement: Present. Denies leaking of fluid.   The following portions of the patient's history were reviewed and updated as appropriate: allergies, current medications, past family history, past medical history, past social history, past surgical history and problem list.   Objective:   Vitals:   06/27/23 0846  BP: 101/69  Pulse: 69  Weight: 153 lb (69.4 kg)    Fetal Status: Fetal Heart Rate (bpm): 135 Fundal Height: 35 cm Movement: Present  Presentation: Vertex  General:  Alert, oriented and cooperative. Patient is in no acute distress.  Skin: Skin is warm and dry. No rash noted.   Cardiovascular: Normal heart rate noted  Respiratory: Normal respiratory effort, no problems with respiration noted  Abdomen: Soft, gravid, appropriate for gestational age.  Pain/Pressure: Present     Pelvic: Cervical exam deferred Dilation: 1.5 Effacement (%): 50 Station: Ballotable  Extremities: Normal range of motion.     Mental Status: Normal mood and affect. Normal behavior. Normal judgment and thought content.   Assessment and Plan:  Pregnancy: G2P1001 at [redacted]w[redacted]d 1. Supervision of other normal pregnancy, antepartum Routine care. Desiring water birth - Strep Gp B NAA - Cervicovaginal ancillary only( [place])  2. [redacted] weeks gestation of pregnancy - Strep Gp B NAA - Cervicovaginal ancillary only( [place])   labor symptoms and general obstetric precautions including but not limited to vaginal bleeding, contractions, leaking of fluid and fetal  movement were reviewed in detail with the patient. Please refer to After Visit Summary for other counseling recommendations.   Return in about 1 week (around 07/04/2023) for 7-10d , low risk ob, in person.  Future Appointments  Date Time Provider Department Center  07/01/2023  9:30 AM [facility] None  07/03/2023  9:45 AM Creig Hines, MD [facility] None  07/05/2023  8:35 AM Federico Flake, MD [facility]  07/11/2023  8:55 AM Macon Large, Jethro Bastos, MD [facility]  07/18/2023  8:35 AM [place] Bing, MD [facility]  07/23/2023 10:35 AM [id number]    [place] Bing, MD

## 2023-06-28 ENCOUNTER — Encounter: Payer: Self-pay | Admitting: Oncology

## 2023-06-28 LAB — CERVICOVAGINAL ANCILLARY ONLY
Chlamydia: NEGATIVE
Comment: NEGATIVE
Comment: NORMAL
Neisseria Gonorrhea: NEGATIVE

## 2023-06-29 LAB — STREP GP B NAA: Strep Gp B NAA: NEGATIVE

## 2023-06-30 ENCOUNTER — Other Ambulatory Visit: Payer: Self-pay | Admitting: *Deleted

## 2023-06-30 DIAGNOSIS — O99013 Anemia complicating pregnancy, third trimester: Secondary | ICD-10-CM

## 2023-06-30 DIAGNOSIS — D708 Other neutropenia: Secondary | ICD-10-CM

## 2023-07-01 ENCOUNTER — Inpatient Hospital Stay: Payer: 59 | Attending: Oncology

## 2023-07-01 DIAGNOSIS — D709 Neutropenia, unspecified: Secondary | ICD-10-CM | POA: Diagnosis present

## 2023-07-01 DIAGNOSIS — D509 Iron deficiency anemia, unspecified: Secondary | ICD-10-CM | POA: Insufficient documentation

## 2023-07-01 DIAGNOSIS — Z3A37 37 weeks gestation of pregnancy: Secondary | ICD-10-CM | POA: Insufficient documentation

## 2023-07-01 DIAGNOSIS — D649 Anemia, unspecified: Secondary | ICD-10-CM | POA: Diagnosis present

## 2023-07-01 DIAGNOSIS — D708 Other neutropenia: Secondary | ICD-10-CM

## 2023-07-01 DIAGNOSIS — O99013 Anemia complicating pregnancy, third trimester: Secondary | ICD-10-CM | POA: Diagnosis present

## 2023-07-01 LAB — CBC
HCT: 39.3 % (ref 36.0–46.0)
Hemoglobin: 13.2 g/dL (ref 12.0–15.0)
MCH: 32.9 pg (ref 26.0–34.0)
MCHC: 33.6 g/dL (ref 30.0–36.0)
MCV: 98 fL (ref 80.0–100.0)
Platelets: 146 10*3/uL — ABNORMAL LOW (ref 150–400)
RBC: 4.01 MIL/uL (ref 3.87–5.11)
RDW: 13.6 % (ref 11.5–15.5)
WBC: 5.7 10*3/uL (ref 4.0–10.5)
nRBC: 0 % (ref 0.0–0.2)

## 2023-07-01 LAB — IRON AND TIBC
Iron: 114 ug/dL (ref 28–170)
Saturation Ratios: 22 % (ref 10.4–31.8)
TIBC: 515 ug/dL — ABNORMAL HIGH (ref 250–450)
UIBC: 401 ug/dL

## 2023-07-01 LAB — FERRITIN: Ferritin: 99 ng/mL (ref 11–307)

## 2023-07-02 ENCOUNTER — Encounter: Payer: Self-pay | Admitting: Oncology

## 2023-07-02 ENCOUNTER — Inpatient Hospital Stay (HOSPITAL_COMMUNITY)
Admission: AD | Admit: 2023-07-02 | Discharge: 2023-07-02 | Disposition: A | Discharge status: Home / Self Care | Payer: 59 | Attending: Obstetrics & Gynecology | Admitting: Obstetrics & Gynecology

## 2023-07-02 ENCOUNTER — Encounter (HOSPITAL_COMMUNITY): Payer: Self-pay | Admitting: Obstetrics & Gynecology

## 2023-07-02 DIAGNOSIS — Z3A36 36 weeks gestation of pregnancy: Secondary | ICD-10-CM | POA: Diagnosis not present

## 2023-07-02 DIAGNOSIS — Z148 Genetic carrier of other disease: Secondary | ICD-10-CM

## 2023-07-02 DIAGNOSIS — Z3480 Encounter for supervision of other normal pregnancy, unspecified trimester: Secondary | ICD-10-CM

## 2023-07-02 DIAGNOSIS — O4703 False labor before 37 completed weeks of gestation, third trimester: Secondary | ICD-10-CM | POA: Diagnosis not present

## 2023-07-02 DIAGNOSIS — Z3689 Encounter for other specified antenatal screening: Secondary | ICD-10-CM | POA: Diagnosis not present

## 2023-07-02 DIAGNOSIS — O479 False labor, unspecified: Secondary | ICD-10-CM

## 2023-07-02 NOTE — MAU Provider Note (Signed)
S Ms. Emily Flowers is a 27 y.o. G10P1001 female who presents to MAU today with complaint of contractions.   ROS: Having ctx for past 1 hr.   O BP 112/62   Pulse 75   Temp 98.1 F (36.7 C) (Oral)   Resp 17   Ht 5\' 6"  (1.676 m)   Wt 69.4 kg   LMP 10/10/2022 (Exact Date)   SpO2 100%   BMI 24.69 kg/m  Physical Exam Vitals and nursing note reviewed.  Constitutional:      General: She is not in acute distress.    Appearance: Normal appearance. She is well-developed.     Comments: Pregnant female  HENT:     Head: Normocephalic and atraumatic.     Nose: Nose normal.     Mouth/Throat:     Mouth: Mucous membranes are moist.  Eyes:     General: No scleral icterus.    Conjunctiva/sclera: Conjunctivae normal.  Cardiovascular:     Rate and Rhythm: Normal rate.  Pulmonary:     Effort: Pulmonary effort is normal.  Chest:     Chest wall: No tenderness.  Abdominal:     General: Abdomen is flat.     Palpations: Abdomen is soft.     Tenderness: There is no abdominal tenderness. There is no guarding or rebound.     Comments: Gravid  Genitourinary:    Vagina: Normal.  Musculoskeletal:        General: Normal range of motion.     Cervical back: Normal range of motion and neck supple.  Skin:    General: Skin is warm and dry.     Capillary Refill: Capillary refill takes less than 2 seconds.     Findings: No rash.  Neurological:     General: No focal deficit present.     Mental Status: She is alert and oriented to person, place, and time.  Psychiatric:        Mood and Affect: Mood normal.   Dilation: 2.5 Effacement (%): 80 Cervical Position: Posterior Station: Ballotable Presentation: Vertex Exam by:: Erle Crocker, RN   MDM: Low  NST:  I reviewed the patient's fetal monitoring.  Baseline HR: 150 Variability:  moderate Accels:present Decels: none  A/P: Reactive NST  Reassured regarding fetal status.    A Early labor Declined cervical exam  recheck   P Discharge from MAU in stable condition Labor precautions discussed  Federico Flake, MD 07/02/2023 11:39 AM

## 2023-07-02 NOTE — MAU Note (Addendum)
Emily Flowers is a 27 y.o. at [redacted]w[redacted]d here in MAU reporting: contracting past hour, 2-5 in with cramping.  Feels like her "vagina is opening', feeling pressure. No bleeding or LOF. Reports +FM Was 1+/50 last Friday.  Onset of complaint: hour ago Pain score: 4 Vitals:   07/02/23 0917  BP: (!) 109/58  Pulse: 92  Resp: 17  Temp: 98.1 F (36.7 C)  SpO2: 100%     FHT:154 Lab orders placed from triage:

## 2023-07-02 NOTE — Discharge Instructions (Signed)
Come to the MAU (maternity admission unit) for 1) Strong contractions every 2-3 minutes for at least 1 hour that do not go away when you drink water or take a warm shower. These contractions will be so strong all you can do is breath through them 2) Vaginal bleeding- anything more than spotting 3) Loss of fluid like you broke your water 4) Decreased movement of your baby  

## 2023-07-03 ENCOUNTER — Encounter: Payer: Self-pay | Admitting: Oncology

## 2023-07-03 ENCOUNTER — Inpatient Hospital Stay (HOSPITAL_BASED_OUTPATIENT_CLINIC_OR_DEPARTMENT_OTHER): Payer: Self-pay | Admitting: Oncology

## 2023-07-03 DIAGNOSIS — D708 Other neutropenia: Secondary | ICD-10-CM | POA: Diagnosis not present

## 2023-07-03 DIAGNOSIS — O99013 Anemia complicating pregnancy, third trimester: Secondary | ICD-10-CM

## 2023-07-03 NOTE — Progress Notes (Signed)
I connected with Emily Flowers on 07/03/23 at  9:45 AM EST by video enabled telemedicine visit and verified that I am speaking with the correct person using two identifiers.   I discussed the limitations, risks, security and privacy concerns of performing an evaluation and management service by telemedicine and the availability of in-person appointments. I also discussed with the patient that there may be a patient responsible charge related to this service. The patient expressed understanding and agreed to proceed.  Other persons participating in the visit and their role in the encounter:  none  Patient's location:  home Provider's location:  work  Stage manager Complaint: Routine follow-up of iron deficiency anemia in pregnancy  History of present illness: patient is a 27 year old African-American female who has been referred for leukopenia/neutropenia white cell count has been mainly fluctuating between 3.5-4.5.  In 2021 her white count did go to 2.9 and then bounced back to 3.5.  Most recent CBC from 09/27/2022 showed a white cell count of 2.1 with an H&H of 12.9/37.6 and a platelet count of 185.  Patient denies any over-the-counter herbal medications.  She was recently checked for HIV hep B and hep C as well which was all negative.    Patient noted to have a hemoglobin of 10 in her third trimester of pregnancy.  She received IV iron for the same  Interval history patient is close to delivering sometime in the next few days.  Overall she feels better after receiving IV iron   Review of Systems  Constitutional:  Negative for chills, fever, malaise/fatigue and weight loss.  HENT:  Negative for congestion, ear discharge and nosebleeds.   Eyes:  Negative for blurred vision.  Respiratory:  Negative for cough, hemoptysis, sputum production, shortness of breath and wheezing.   Cardiovascular:  Negative for chest pain, palpitations, orthopnea and claudication.  Gastrointestinal:  Negative for abdominal  pain, blood in stool, constipation, diarrhea, heartburn, melena, nausea and vomiting.  Genitourinary:  Negative for dysuria, flank pain, frequency, hematuria and urgency.  Musculoskeletal:  Negative for back pain, joint pain and myalgias.  Skin:  Negative for rash.  Neurological:  Negative for dizziness, tingling, focal weakness, seizures, weakness and headaches.  Endo/Heme/Allergies:  Does not bruise/bleed easily.  Psychiatric/Behavioral:  Negative for depression and suicidal ideas. The patient does not have insomnia.     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   Subclinical hyperthyroidism 09/28/2022   Urge incontinence 10/04/2022    Past Surgical History:  Procedure Laterality Date   TONSILLECTOMY Bilateral 04/08/2020   Procedure: TONSILLECTOMY;  Surgeon: Linus Salmons, MD;  Location: [facility];  Service: ENT;  Laterality: Bilateral;   WISDOM TOOTH EXTRACTION      Social History   Socioeconomic History   Marital status: Married    Spouse name: Not on file   Number of children: Not on file   Years of education: Not on file   Highest education level: Not on file  Occupational History   Not on file  Tobacco Use   Smoking status: Never    Passive exposure: Never   Smokeless tobacco: Never  Vaping Use   Vaping status: Never Used  Substance  and Sexual Activity   Alcohol use: No    Alcohol/week: 0.0 standard drinks of alcohol   Drug use: No   Sexual activity: Yes    Partners: Male    Birth control/protection: Condom  Other Topics Concern   Not on file  Social History Narrative   ** Merged History Encounter **       Social Determinants of Health   Financial Resource Strain: Not  on file  Food Insecurity: No Food Insecurity (10/03/2022)   Hunger Vital Sign    Worried About Running Out of Food in the Last Year: Never true    Ran Out of Food in the Last Year: Never true  Transportation Needs: No Transportation Needs (10/03/2022)   PRAPARE - Administrator, Civil Service (Medical): No    Lack of Transportation (Non-Medical): No  Physical Activity: Not on file  Stress: Not on file  Social Connections: Unknown (01/08/2022)   Received from [facility]   Social Network    Social Network: Not on file  Intimate Partner Violence: Not At Risk (10/03/2022)   Humiliation, Afraid, Rape, and Kick questionnaire    Fear of Current or Ex-Partner: No    Emotionally Abused: No    Physically Abused: No    Sexually Abused: No    Family History  Problem Relation Age of Onset   Diabetes Father    Cancer Maternal Aunt        breast   Diabetes Paternal Aunt    Diabetes Paternal Uncle      Current Outpatient Medications:    acetaminophen (TYLENOL) 500 MG tablet, Take by mouth., Disp: , Rfl:    cetirizine (ZYRTEC) 10 MG chewable tablet, Chew 10 mg by mouth daily., Disp: , Rfl:    Prenatal Vit-Fe Fumarate-FA (MULTIVITAMIN-PRENATAL) 27-0.8 MG TABS tablet, Take 1 tablet by mouth daily at 12 noon., Disp: , Rfl:   No results found.  No images are attached to the encounter.      Latest Ref Rng & Units 09/27/2022    9:39 AM  CMP  Glucose 70 - 99 mg/dL 74   BUN 6 - 20 mg/dL 11   Creatinine 1.47 - 1.00 mg/dL 8.29   Sodium 562 - 130 mmol/L 140   Potassium 3.5 - 5.2 mmol/L 4.1   Chloride 96 - 106 mmol/L 104   CO2 20 - 29 mmol/L 21   Calcium 8.7 - 10.2 mg/dL 9.1   Total Protein 6.0 - 8.5 g/dL 7.2   Total Bilirubin 0.0 - 1.2 mg/dL 0.4   Alkaline Phos 44 - 121 IU/L 52   AST 0 - 40 IU/L 20   ALT 0 - 32 IU/L 15       Latest Ref Rng & Units 07/01/2023    9:43 AM  CBC  WBC 4.0 - 10.5 K/uL 5.7   Hemoglobin 12.0 - 15.0 g/dL 86.5   Hematocrit 78.4 - 46.0 %  39.3   Platelets 150 - 400 K/uL 146      Observation/objective: Appears in no acute distress over video visit today.  Breathing is nonlabored  Assessment and plan: Patient is a 27 year old female with history of iron deficiency anemia in pregnancy  Hemoglobin is improved from 10-13.2 afterGiving IV iron.  Iron studies are within normal limits.  She does not require any IV iron at this time.  I will repeat CBC ferritin and iron studies and B12 levels 4 months from now and see her thereafter  Follow-up  instructions: As above  I discussed the assessment and treatment plan with the patient. The patient was provided an opportunity to ask questions and all were answered. The patient agreed with the plan and demonstrated an understanding of the instructions.   The patient was advised to call back or seek an in-person evaluation if the symptoms worsen or if the condition fails to improve as anticipated.  I provided 11 minutes of face-to-face video visit time during this encounter, and > 50% was spent counseling as documented under my assessment & plan.  Visit Diagnosis: 1. Other neutropenia (HCC)   2. Anemia affecting pregnancy in third trimester     Dr. Owens Shark, MD, MPH [facility] at [facility] Tel- [phone] 07/03/2023 1:05 PM

## 2023-07-05 ENCOUNTER — Ambulatory Visit (INDEPENDENT_AMBULATORY_CARE_PROVIDER_SITE_OTHER): Payer: 59 | Admitting: Family Medicine

## 2023-07-05 VITALS — BP 96/63 | HR 71 | Wt 154.0 lb

## 2023-07-05 DIAGNOSIS — O99013 Anemia complicating pregnancy, third trimester: Secondary | ICD-10-CM

## 2023-07-05 DIAGNOSIS — Z3480 Encounter for supervision of other normal pregnancy, unspecified trimester: Secondary | ICD-10-CM

## 2023-07-05 NOTE — Progress Notes (Signed)
   PRENATAL VISIT NOTE  Subjective:  Emily Flowers is a 27 y.o. G2P1001 at [redacted]w[redacted]d being seen today for ongoing prenatal care.  She is currently monitored for the following issues for this low-risk pregnancy and has Supervision of other normal pregnancy, antepartum; Carrier of spinal muscular atrophy; and Anemia affecting pregnancy in third trimester on their problem list.  Patient reports no complaints.  Contractions: Irregular. Vag. Bleeding: None.  Movement: Present. Denies leaking of fluid.   The following portions of the patient's history were reviewed and updated as appropriate: allergies, current medications, past family history, past medical history, past social history, past surgical history and problem list.   Objective:   Vitals:   07/05/23 0849  BP: 96/63  Pulse: 71  Weight: 154 lb (69.9 kg)    Fetal Status: Fetal Heart Rate (bpm): 138   Movement: Present     General:  Alert, oriented and cooperative. Patient is in no acute distress.  Skin: Skin is warm and dry. No rash noted.   Cardiovascular: Normal heart rate noted  Respiratory: Normal respiratory effort, no problems with respiration noted  Abdomen: Soft, gravid, appropriate for gestational age.  Pain/Pressure: Present     Pelvic: Cervical exam deferred        Extremities: Normal range of motion.  Edema: None  Mental Status: Normal mood and affect. Normal behavior. Normal judgment and thought content.   Assessment and Plan:  Pregnancy: G2P1001 at [redacted]w[redacted]d 1. Supervision of other normal pregnancy, antepartum Seen in MAU for ctx but they stopped and patient was discharged Term now (hooray) Continues to desires WB, no concerns about this process Acid reflux- recommended Pepcid  2. Anemia affecting pregnancy in third trimester Lab Results  Component Value Date   HGB 13.2 07/01/2023   HGB 10.4 (L) 04/30/2023  Improved with increased Fe intake  Term labor symptoms and general obstetric precautions including but  not limited to vaginal bleeding, contractions, leaking of fluid and fetal movement were reviewed in detail with the patient. Please refer to After Visit Summary for other counseling recommendations.   Return in about 1 week (around 07/12/2023) for Routine prenatal care.  Future Appointments  Date Time Provider Department Center  07/11/2023  8:55 AM Anyanwu, Jethro Bastos, MD [facility]  07/18/2023  8:35 AM [place] Bing, MD [facility]  07/23/2023 10:35 AM [facility]  10/22/2023  9:15 AM [facility] None  10/22/2023  9:30 AM Creig Hines, MD [facility] None    Federico Flake, MD

## 2023-07-05 NOTE — Progress Notes (Signed)
ROB  Recent MAU visit for contractions.   CC: Heart burn taking natural papaya extract notices relief

## 2023-07-06 ENCOUNTER — Encounter (HOSPITAL_COMMUNITY): Payer: Self-pay | Admitting: Obstetrics and Gynecology

## 2023-07-06 ENCOUNTER — Inpatient Hospital Stay (HOSPITAL_COMMUNITY)
Admission: AD | Admit: 2023-07-06 | Discharge: 2023-07-07 | DRG: 807 | Disposition: A | Discharge status: Home / Self Care | Payer: 59 | Attending: Obstetrics and Gynecology | Admitting: Obstetrics and Gynecology

## 2023-07-06 ENCOUNTER — Other Ambulatory Visit: Payer: Self-pay

## 2023-07-06 DIAGNOSIS — Z882 Allergy status to sulfonamides status: Secondary | ICD-10-CM

## 2023-07-06 DIAGNOSIS — Z3A37 37 weeks gestation of pregnancy: Secondary | ICD-10-CM | POA: Diagnosis not present

## 2023-07-06 DIAGNOSIS — Z8616 Personal history of COVID-19: Secondary | ICD-10-CM | POA: Diagnosis not present

## 2023-07-06 DIAGNOSIS — Z833 Family history of diabetes mellitus: Secondary | ICD-10-CM

## 2023-07-06 DIAGNOSIS — Z2989 Encounter for other specified prophylactic measures: Secondary | ICD-10-CM | POA: Diagnosis not present

## 2023-07-06 DIAGNOSIS — O9902 Anemia complicating childbirth: Secondary | ICD-10-CM | POA: Diagnosis not present

## 2023-07-06 DIAGNOSIS — Z3480 Encounter for supervision of other normal pregnancy, unspecified trimester: Secondary | ICD-10-CM

## 2023-07-06 DIAGNOSIS — O99013 Anemia complicating pregnancy, third trimester: Secondary | ICD-10-CM | POA: Diagnosis present

## 2023-07-06 DIAGNOSIS — Z148 Genetic carrier of other disease: Secondary | ICD-10-CM

## 2023-07-06 DIAGNOSIS — O26893 Other specified pregnancy related conditions, third trimester: Secondary | ICD-10-CM | POA: Diagnosis not present

## 2023-07-06 DIAGNOSIS — Z23 Encounter for immunization: Secondary | ICD-10-CM | POA: Diagnosis not present

## 2023-07-06 HISTORY — DX: Anemia, unspecified: D64.9

## 2023-07-06 LAB — CBC
HCT: 38 % (ref 36.0–46.0)
Hemoglobin: 12.5 g/dL (ref 12.0–15.0)
MCH: 32.3 pg (ref 26.0–34.0)
MCHC: 32.9 g/dL (ref 30.0–36.0)
MCV: 98.2 fL (ref 80.0–100.0)
Platelets: 146 10*3/uL — ABNORMAL LOW (ref 150–400)
RBC: 3.87 MIL/uL (ref 3.87–5.11)
RDW: 13.7 % (ref 11.5–15.5)
WBC: 6.8 10*3/uL (ref 4.0–10.5)
nRBC: 0 % (ref 0.0–0.2)

## 2023-07-06 LAB — RPR: RPR Ser Ql: NONREACTIVE

## 2023-07-06 LAB — TYPE AND SCREEN
ABO/RH(D): O POS
Antibody Screen: NEGATIVE

## 2023-07-06 MED ORDER — OXYCODONE HCL 5 MG PO TABS
5.0000 mg | ORAL_TABLET | ORAL | Status: DC | PRN
Start: 2023-07-06 — End: 2023-07-07

## 2023-07-06 MED ORDER — ONDANSETRON HCL 4 MG/2ML IJ SOLN: 4.0000 mg | INTRAMUSCULAR | Status: DC | PRN

## 2023-07-06 MED ORDER — FLEET ENEMA RE ENEM: 1.0000 | ENEMA | RECTAL | Status: DC | PRN

## 2023-07-06 MED ORDER — OXYCODONE-ACETAMINOPHEN 5-325 MG PO TABS: 2.0000 | ORAL_TABLET | ORAL | Status: DC | PRN

## 2023-07-06 MED ORDER — OXYCODONE HCL 5 MG PO TABS: 10.0000 mg | ORAL_TABLET | ORAL | Status: DC | PRN

## 2023-07-06 MED ORDER — LIDOCAINE HCL (PF) 1 % IJ SOLN: 30.0000 mL | INTRAMUSCULAR | Status: DC | PRN

## 2023-07-06 MED ORDER — COCONUT OIL OIL: 1.0000 | TOPICAL_OIL | Status: DC | PRN

## 2023-07-06 MED ORDER — ONDANSETRON HCL 4 MG PO TABS: 4.0000 mg | ORAL_TABLET | ORAL | Status: DC | PRN

## 2023-07-06 MED ORDER — DIBUCAINE (PERIANAL) 1 % EX OINT: 1.0000 | TOPICAL_OINTMENT | CUTANEOUS | Status: DC | PRN

## 2023-07-06 MED ORDER — IBUPROFEN 600 MG PO TABS
600.0000 mg | ORAL_TABLET | Freq: Once | ORAL | Status: AC
  Administered 2023-07-06: 600 mg via ORAL
  Filled 2023-07-06: qty 1

## 2023-07-06 MED ORDER — TERBUTALINE SULFATE 1 MG/ML IJ SOLN: 0.2500 mg | Freq: Once | INTRAMUSCULAR | Status: DC | PRN

## 2023-07-06 MED ORDER — LACTATED RINGERS IV SOLN: 500.0000 mL | INTRAVENOUS | Status: DC | PRN

## 2023-07-06 MED ORDER — PRENATAL MULTIVITAMIN CH
1.0000 | ORAL_TABLET | Freq: Every day | ORAL | Status: DC
  Administered 2023-07-07: 1 via ORAL
  Filled 2023-07-06: qty 1

## 2023-07-06 MED ORDER — OXYTOCIN-SODIUM CHLORIDE 30-0.9 UT/500ML-% IV SOLN
1.0000 m[IU]/min | INTRAVENOUS | Status: DC
  Administered 2023-07-06: 2 m[IU]/min via INTRAVENOUS

## 2023-07-06 MED ORDER — OXYTOCIN BOLUS FROM INFUSION: 333.0000 mL | Freq: Once | INTRAVENOUS | Status: DC

## 2023-07-06 MED ORDER — DOCUSATE SODIUM 100 MG PO CAPS
100.0000 mg | ORAL_CAPSULE | Freq: Two times a day (BID) | ORAL | Status: DC
  Administered 2023-07-07 (×2): 100 mg via ORAL
  Filled 2023-07-06 (×2): qty 1

## 2023-07-06 MED ORDER — ONDANSETRON HCL 4 MG/2ML IJ SOLN: 4.0000 mg | Freq: Four times a day (QID) | INTRAMUSCULAR | Status: DC | PRN

## 2023-07-06 MED ORDER — ACETAMINOPHEN 325 MG PO TABS: 650.0000 mg | ORAL_TABLET | ORAL | Status: DC | PRN

## 2023-07-06 MED ORDER — WITCH HAZEL-GLYCERIN EX PADS: 1.0000 | MEDICATED_PAD | CUTANEOUS | Status: DC | PRN

## 2023-07-06 MED ORDER — OXYTOCIN-SODIUM CHLORIDE 30-0.9 UT/500ML-% IV SOLN
2.5000 [IU]/h | INTRAVENOUS | Status: DC
  Filled 2023-07-06: qty 500

## 2023-07-06 MED ORDER — DIPHENHYDRAMINE HCL 25 MG PO CAPS: 25.0000 mg | ORAL_CAPSULE | Freq: Four times a day (QID) | ORAL | Status: DC | PRN

## 2023-07-06 MED ORDER — SOD CITRATE-CITRIC ACID 500-334 MG/5ML PO SOLN: 30.0000 mL | ORAL | Status: DC | PRN

## 2023-07-06 MED ORDER — OXYCODONE-ACETAMINOPHEN 5-325 MG PO TABS: 1.0000 | ORAL_TABLET | ORAL | Status: DC | PRN

## 2023-07-06 MED ORDER — BENZOCAINE-MENTHOL 20-0.5 % EX AERO: 1.0000 | INHALATION_SPRAY | CUTANEOUS | Status: DC | PRN

## 2023-07-06 MED ORDER — IBUPROFEN 600 MG PO TABS
600.0000 mg | ORAL_TABLET | Freq: Four times a day (QID) | ORAL | Status: DC
  Administered 2023-07-06 – 2023-07-07 (×4): 600 mg via ORAL
  Filled 2023-07-06 (×4): qty 1

## 2023-07-06 MED ORDER — SIMETHICONE 80 MG PO CHEW: 80.0000 mg | CHEWABLE_TABLET | ORAL | Status: DC | PRN

## 2023-07-06 MED ORDER — LACTATED RINGERS IV SOLN: INTRAVENOUS | Status: DC

## 2023-07-06 NOTE — Progress Notes (Signed)
Patient was walking around unit.  Contractions are increasing in frequency every 3-5 minutes Increased rectal pressure   Patient Vitals for the past 24 hrs:  BP Temp Temp src Pulse Resp  07/06/23 0355 112/75 -- -- 72 17  07/06/23 0315 129/76 98.2 F (36.8 C) Oral 72 17  07/06/23 0243 118/76 98.1 F (36.7 C) Oral 75 16   Rechecked cervix  Dilation: 8 Effacement (%): 90 Station: 0, Plus 1 Presentation: Vertex Exam by:: Dr. Alvester Morin   AROM performed with clear fluid Intermittent FHR 130s + accels auscultated  Plan:  Expectant management Patient getting in tub now Await vaginal birth  Federico Flake, MD

## 2023-07-06 NOTE — Plan of Care (Signed)
  Problem: Education: Goal: Knowledge of General Education information will improve Description: Including pain rating scale, medication(s)/side effects and non-pharmacologic comfort measures Outcome: Progressing   Problem: Health Behavior/Discharge Planning: Goal: Ability to manage health-related needs will improve Outcome: Progressing   Problem: Clinical Measurements: Goal: Ability to maintain clinical measurements within normal limits will improve Outcome: Progressing Goal: Will remain free from infection Outcome: Progressing Goal: Diagnostic test results will improve Outcome: Progressing Goal: Respiratory complications will improve Outcome: Progressing Goal: Cardiovascular complication will be avoided Outcome: Progressing   Problem: Activity: Goal: Risk for activity intolerance will decrease Outcome: Progressing   Problem: Nutrition: Goal: Adequate nutrition will be maintained Outcome: Progressing   Problem: Coping: Goal: Level of anxiety will decrease Outcome: Progressing   Problem: Elimination: Goal: Will not experience complications related to bowel motility Outcome: Progressing Goal: Will not experience complications related to urinary retention Outcome: Progressing   Problem: Pain Management: Goal: General experience of comfort will improve Outcome: Progressing   Problem: Safety: Goal: Ability to remain free from injury will improve Outcome: Progressing   Problem: Skin Integrity: Goal: Risk for impaired skin integrity will decrease Outcome: Progressing   Problem: Education: Goal: Knowledge of condition will improve Outcome: Progressing Goal: Individualized Educational Video(s) Outcome: Progressing Goal: Individualized Newborn Educational Video(s) Outcome: Progressing   Problem: Activity: Goal: Will verbalize the importance of balancing activity with adequate rest periods Outcome: Progressing Goal: Ability to tolerate increased activity will  improve Outcome: Progressing   Problem: Coping: Goal: Ability to identify and utilize available resources and services will improve Outcome: Progressing   Problem: Life Cycle: Goal: Chance of risk for complications during the postpartum period will decrease Outcome: Progressing   Problem: Role Relationship: Goal: Ability to demonstrate positive interaction with newborn will improve Outcome: Progressing   Problem: Skin Integrity: Goal: Demonstration of wound healing without infection will improve Outcome: Progressing

## 2023-07-06 NOTE — MAU Note (Signed)
Emily Flowers is a 27 y.o. at [redacted]w[redacted]d here in MAU reporting: ctx every 3-5 minutes that started around noon yesterday but stopped around 6pm. Then kicked back up at 0130 today/ Denies VB and LOF, endorses +FM.   Onset of complaint: 0130 Pain score: ctx 5/10 Vitals:   07/06/23 0243  BP: 118/76  Pulse: 75  Resp: 16  Temp: 98.1 F (36.7 C)     FHT:125 Lab orders placed from triage:  MAU labor

## 2023-07-06 NOTE — Discharge Summary (Signed)
Postpartum Discharge Summary  Date of Service updated***     Patient Name: Emily Flowers DOB: Aug 11, 1996 MRN: [id number]  Date of admission: 07/06/2023 Delivery date:07/06/2023 Delivering provider: Federico Flake Date of discharge: 07/06/2023  Admitting diagnosis: Normal labor and delivery [O80] Intrauterine pregnancy: [redacted]w[redacted]d     Secondary diagnosis:  Principal Problem:   Waterbirth Active Problems:   Supervision of other normal pregnancy, antepartum   Carrier of spinal muscular atrophy   Anemia affecting pregnancy in third trimester  Additional problems: ***    Discharge diagnosis: Term Pregnancy Delivered                                              Post partum procedures: None Augmentation: Pitocin Complications: None  Hospital course: Onset of Labor With Vaginal Delivery      27 y.o. yo G2P1001 at [redacted]w[redacted]d was admitted in Active Labor on 07/06/2023. Labor course was complicated by***  Membrane Rupture Time/Date: 5:08 AM,07/06/2023  Delivery Method:Vaginal, Spontaneous Operative Delivery:N/A Episiotomy: None Lacerations:  None Patient had a postpartum course complicated by ***.  She is ambulating, tolerating a regular diet, passing flatus, and urinating well. Patient is discharged home in stable condition on 07/06/23.  Newborn Data: Birth date:07/06/2023 Birth time:12:47 PM Gender:Female Living status:Living Apgars:8 ,9  Weight:   Magnesium Sulfate received: No BMZ received: No Rhophylac:No MMR:No T-DaP:Given prenatally Flu: Yes, prenatally RSV Vaccine received: No Transfusion:No Immunizations administered: Immunization History  Administered Date(s) Administered   DTaP 09/03/1994, 02/15/1995, 04/26/1995, 11/26/1995, 07/12/1998   HIB (PRP-OMP) 02/15/1995, 04/26/1995, 11/26/1995   Hepatitis A 11/17/2008, 12/20/2009   Hepatitis B Sep 17, 1993, 09/03/1994, 02/15/1995   Hpv-Unspecified 12/20/2009, 04/14/2010, 12/26/2010   IPV 09/03/1994, 02/15/1995,  04/26/1995, 07/12/1998   Influenza, Seasonal, Injecte, Preservative Fre 06/02/2018, 06/16/2019   Influenza,inj,Quad PF,6+ Mos 08/26/2013, 06/02/2018, 06/16/2019, 06/19/2020   Influenza-Unspecified 06/04/2018   MMR 11/26/1995, 07/12/1998   Meningococcal Conjugate 11/17/2008, 12/26/2010   PFIZER(Purple Top)SARS-COV-2 Vaccination 11/04/2019, 11/25/2019, 06/08/2020   Tdap 11/17/2008, 02/18/2019, 04/17/2023   Varicella 11/26/1995, 11/17/2008    Physical exam  Vitals:   07/06/23 1310 07/06/23 1315 07/06/23 1320 07/06/23 1325  BP:  (!) 97/48    Pulse:  62    Resp:      Temp:      TempSrc:      SpO2: 100% 100% 100% 100%   General: alert, cooperative, and no distress Lochia: {Desc; appropriate/inappropriate:30686::"appropriate"} Uterine Fundus: {Desc; firm/soft:30687} Incision: {Exam; incision:21111123} DVT Evaluation: {Exam; DUK:0254270} Labs: Lab Results  Component Value Date   WBC 6.8 07/06/2023   HGB 12.5 07/06/2023   HCT 38.0 07/06/2023   MCV 98.2 07/06/2023   PLT 146 (L) 07/06/2023      Latest Ref Rng & Units 09/27/2022    9:39 AM  CMP  Glucose 70 - 99 mg/dL 74   BUN 6 - 20 mg/dL 11   Creatinine 6.23 - 1.00 mg/dL 7.62   Sodium 831 - 517 mmol/L 140   Potassium 3.5 - 5.2 mmol/L 4.1   Chloride 96 - 106 mmol/L 104   CO2 20 - 29 mmol/L 21   Calcium 8.7 - 10.2 mg/dL 9.1   Total Protein 6.0 - 8.5 g/dL 7.2   Total Bilirubin 0.0 - 1.2 mg/dL 0.4   Alkaline Phos 44 - 121 IU/L 52   AST 0 - 40 IU/L 20   ALT 0 -  32 IU/L 15    Edinburgh Score:     No data to display            After visit meds:  Allergies as of 07/06/2023       Reactions   Sulfa Antibiotics    Unknown childhood reaction     Med Rec must be completed prior to using this [facility]***        Discharge home in stable condition Infant Feeding: {Baby feeding:23562} Infant Disposition:{CHL IP OB HOME WITH WJXBJY:78295} Discharge instruction: per After Visit Summary and Postpartum  booklet. Activity: Advance as tolerated. Pelvic rest for 6 weeks.  Diet: {OB diet:21111121} Anticipated Birth Control: Condoms Postpartum Appointment:4 weeks Additional Postpartum F/U:  NA  Future Appointments: Future Appointments  Date Time Provider Department Center  07/11/2023  8:55 AM Anyanwu, Jethro Bastos, MD [facility]  07/18/2023  8:35 AM [place] Bing, MD [facility]  07/23/2023 10:35 AM [facility]  10/22/2023  9:15 AM [facility] None  10/22/2023  9:30 AM Creig Hines, MD [facility] None   Follow up Visit:      07/06/2023 Federico Flake, MD

## 2023-07-06 NOTE — Lactation Note (Signed)
This note was copied from a baby's chart. Lactation Consultation Note  Patient Name: Emily Flowers [id number] Date: 07/06/2023 Age:27 years Reason for consult: Initial assessment;Early term 37-38.6wks.  P2. ETI female infant. MOB given hand pump earlier today, LC confirmed her flange size is 24 mm. LC ask MOB to hand express prior to latching infant or use hand pump prior to latching infant on her right breast only to help evert nipple outward. MOB latched infant on her right breast using the football hold position, infant was on and off the breast for 10 minutes, but when latch the latch was deep and swallows were heard. MOB will continue working on latching infant at the breast and knows to call for latch assistance if needed. LC discussed infant's input and output. MOB was made aware of O/P services, breastfeeding support groups, community resources, and our phone # for post-discharge questions.    Today's current plan: 1- MOB knows she can hand express or pre-pump breast when latching infant on her right breast only. MOB will continue to BF infant by cues, on demand, every 2-3 hours, skin to skin. 2- MOB knows to call for further latch assistance if needed. 3- LC discussed the importance of maternal rest, hydration and balance diet.  Maternal Data Has patient been taught Hand Expression?: Yes Does the patient have breastfeeding experience prior to this delivery?: Yes How long did the patient breastfeed?: Per MOB, she BF 1st child for one year.  Feeding Mother's Current Feeding Choice: Breast Milk  LATCH Score Latch: Repeated attempts needed to sustain latch, nipple held in mouth throughout feeding, stimulation needed to elicit sucking reflex.  Audible Swallowing: Spontaneous and intermittent  Type of Nipple: Inverted (only right breast is inverted)  Comfort (Breast/Nipple): Soft / non-tender  Hold (Positioning): Assistance needed to correctly position infant at breast and maintain  latch.  LATCH Score: 6   Lactation Tools Discussed/Used    Interventions Interventions: Breast feeding basics reviewed;Assisted with latch;Skin to skin;Pre-pump if needed;Hand express;Breast compression;Adjust position;Support pillows;Position options;Expressed milk;Education;Guidelines for Milk Supply and Pumping Schedule Handout;LC Services brochure  Discharge Pump: DEBP;Manual;Personal;Hands Free  Consult Status Consult Status: Follow-up Date: 07/07/23 Follow-up type: In-patient    Frederico Hamman 07/06/2023, 5:50 PM

## 2023-07-06 NOTE — Lactation Note (Signed)
This note was copied from a baby's chart. Lactation Consultation Note  Patient Name: Emily Flowers [id number]'O Date: 07/06/2023 Age:27 hours Reason for consult: Mother's request;Follow-up assessment MOB requested latch assistance. LC gave support pillows and MOB latched infant on her left breast using the cross cradle hold position, infant latched with depth and was still BF after 4 minutes when LC left the room. MOB will continue to work towards latching infant at the breast and knows to call RN/LC for additional latch assistance if needed.   Maternal Data    Feeding    LATCH Score Latch: Grasps breast easily, tongue down, lips flanged, rhythmical sucking.  Audible Swallowing: A few with stimulation  Type of Nipple: Everted at rest and after stimulation  Comfort (Breast/Nipple): Soft / non-tender  Hold (Positioning): Assistance needed to correctly position infant at breast and maintain latch.  LATCH Score: 8   Lactation Tools Discussed/Used    Interventions Interventions: Assisted with latch;Skin to skin;Support pillows;Adjust position;Breast compression;Position options;Education  Discharge    Consult Status Consult Status: Follow-up Date: 07/07/23 Follow-up type: In-patient    Frederico Hamman 07/06/2023, 10:21 PM

## 2023-07-06 NOTE — H&P (Signed)
OBSTETRIC ADMISSION HISTORY AND PHYSICAL  Emily Flowers is a 27 y.o. female G2P1001 with IUP at [redacted]w[redacted]d by 1st TMUS presenting for SOL. She reports +FMs, No LOF, no VB, no blurry vision, headaches or peripheral edema, and RUQ pain.  She plans on breast feeding. She request condoms for birth control. 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   Urge incontinence 10/04/2022    Past Surgical History: Past Surgical History:  Procedure Laterality Date   TONSILLECTOMY Bilateral 04/08/2020   Procedure: TONSILLECTOMY;  Surgeon: Linus Salmons, MD;  Location: [facility];  Service: ENT;  Laterality: Bilateral;   TONSILLECTOMY     WISDOM TOOTH EXTRACTION      Obstetrical History: OB History     Gravida  2   Para  1   Term  1   Preterm      AB      Living  1      SAB      IAB      Ectopic      Multiple      Live Births  1           Social History Social History   Socioeconomic History   Marital status: Married    Spouse name: Christiane Ha   Number of children: 1   Years of education: Not on file   Highest education level: Not on file  Occupational History   Not on file  Tobacco Use   Smoking status: Never    Passive exposure: Never   Smokeless tobacco: Never  Vaping Use   Vaping status: Never Used  Substance and Sexual Activity   Alcohol use: No    Alcohol/week: 0.0  standard drinks of alcohol   Drug use: No   Sexual activity: Yes    Partners: Male    Birth control/protection: Condom    Comment: last ic tuesday  Other Topics Concern   Not on file  Social History Narrative   ** Merged History Encounter **       Social Determinants of Health   Financial Resource Strain: Not on file  Food Insecurity: No Food Insecurity (07/06/2023)   Hunger Vital Sign    Worried About Running Out of Food in the Last Year: Never true    Ran Out of Food in the Last Year: Never true  Transportation Needs: No Transportation Needs (07/06/2023)   PRAPARE - Administrator, Civil Service (Medical): No    Lack of Transportation (Non-Medical): No  Physical Activity: Not on file  Stress: Not on file  Social Connections: Unknown (01/08/2022)   Received from [facility]   Social Network    Social Network: Not on file    Family History: Family History  Problem Relation Age of Onset   Diabetes Father    Cancer Maternal Aunt  breast   Diabetes Paternal Aunt    Diabetes Paternal Uncle     Allergies: Allergies  Allergen Reactions   Sulfa Antibiotics     Unknown childhood reaction    Medications Prior to Admission  Medication Sig Dispense Refill Last Dose   Prenatal Vit-Fe Fumarate-FA (MULTIVITAMIN-PRENATAL) 27-0.8 MG TABS tablet Take 1 tablet by mouth daily at 12 noon.   Past Week   acetaminophen (TYLENOL) 500 MG tablet Take by mouth.      cetirizine (ZYRTEC) 10 MG chewable tablet Chew 10 mg by mouth daily.        Review of Systems   All systems reviewed and negative except as stated in HPI  Blood pressure 112/75, pulse 72, temperature 98.2 F (36.8 C), temperature source Oral, resp. rate 17, last menstrual period 10/10/2022. General appearance: alert, cooperative, appears stated age, and no distress Lungs: clear to auscultation bilaterally Heart: regular rate and rhythm Abdomen: soft, non-tender; bowel sounds  normal Pelvic: adequate; proven to 7#2oz  Extremities: Homans sign is negative, no sign of DVT Presentation: cephalic Fetal monitoringBaseline: 130 bpm, Variability: Fair (1-6 bpm), Accelerations: Non-reactive but appropriate for gestational age, and Decelerations: Absent Uterine activityFrequency: Every 8 minutes Dilation: 6 Effacement (%): 90 Station: 0 Exam by:: Joline Salt, RN   Prenatal labs: ABO, Rh: --/--/PENDING (11/09 0304) Antibody: PENDING (11/09 0304) Rubella: 3.55 (05/03 1137) RPR: Non Reactive (08/21 0924)  HBsAg: Negative (05/03 1137)  HIV: Non Reactive (08/21 0924)  GBS: Negative/-- (10/31 0924)  2 hr Glucola passed Genetic screening  carrier spinal muscular atrophy; Panorama low risk Anatomy US normal  Prenatal Transfer Tool  Maternal Diabetes: No Genetic Screening: Abnormal:  Results: Other: carrier spinal muscular atrophy Maternal Ultrasounds/Referrals: Normal Fetal Ultrasounds or other Referrals:  None Maternal Substance Abuse:  No Significant Maternal Medications:  None Significant Maternal Lab Results:  Group B Strep negative Number of Prenatal Visits:greater than 3 verified prenatal visits Other Comments:  None  Results for orders placed or performed during the hospital encounter of 07/06/23 (from the past 24 hour(s))  CBC   Collection Time: 07/06/23  2:55 AM  Result Value Ref Range   WBC 6.8 4.0 - 10.5 K/uL   RBC 3.87 3.87 - 5.11 MIL/uL   Hemoglobin 12.5 12.0 - 15.0 g/dL   HCT 45.4 09.8 - 11.9 %   MCV 98.2 80.0 - 100.0 fL   MCH 32.3 26.0 - 34.0 pg   MCHC 32.9 30.0 - 36.0 g/dL   RDW 14.7 82.9 - 56.2 %   Platelets 146 (L) 150 - 400 K/uL   nRBC 0.0 0.0 - 0.2 %  Type and screen [facility]   Collection Time: 07/06/23  3:04 AM  Result Value Ref Range   ABO/RH(D) PENDING    Antibody Screen PENDING    Sample Expiration      07/09/2023,2359 Performed at [facility], [address] [place], [place] [address]      Patient Active Problem List   Diagnosis Date Noted   Normal labor and delivery 07/06/2023   Anemia affecting pregnancy in third trimester 04/30/2023   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   Supervision of other normal pregnancy, antepartum 12/04/2022    Assessment/Plan:  Emily Flowers is a 27 y.o. G2P1001 at [redacted]w[redacted]d here for SOL desiring waterbirth  #Labor:Expectant management. Discussed immersion in the water. Reviewed risk/benefits and process. Patient signed consent. OK to get into water.  #Pain: Hydrotherapy #FWB: Cat II; overall reassuring #ID:  GBS neg #MOF: Breast #MOC: Condoms #Circ:  Desired if female  Federico Flake, MD  07/06/2023, 4:09 AM

## 2023-07-06 NOTE — Progress Notes (Signed)
Labor Progress Note Emily Flowers is a 27 y.o. G2P1001 at [redacted]w[redacted]d presented for SOL  S: contractions have spaced. She was in the tub from 5:30-7:45 ish but felt that contractions were spacing. She walked and rested.   O:  BP 102/61   Pulse 69   Temp 98.2 F (36.8 C) (Oral)   Resp 19   LMP 10/10/2022 (Exact Date)  EFM: 140/moderate/+accels, no decels  CVE: Dilation: 8 Effacement (%): 90 Station: 0, Plus 1 Presentation: Vertex Exam by:: Ralene Bathe MD AROM@5 :08 AM  A&P: 27 y.o. G2P1001 [redacted]w[redacted]d SOL #Labor: No change in dilation. Discussed cytotec vs pitocin. Reviewed risk/benefits of each option. Patient would like to start pitocin and then have it come off once contractions are regular. Will start at 2mu and increase by 1 mu.  #Pain: natural coping, movement and breathing now. If off pitocin may immerse in water #FWB: Cat 1. Was intermittent monitoring. Transition to continuous while on pitocin.  #GBS negative  Federico Flake, MD 10:45 AM

## 2023-07-07 LAB — CBC
HCT: 30.7 % — ABNORMAL LOW (ref 36.0–46.0)
Hemoglobin: 10.4 g/dL — ABNORMAL LOW (ref 12.0–15.0)
MCH: 32.5 pg (ref 26.0–34.0)
MCHC: 33.9 g/dL (ref 30.0–36.0)
MCV: 95.9 fL (ref 80.0–100.0)
Platelets: 138 10*3/uL — ABNORMAL LOW (ref 150–400)
RBC: 3.2 MIL/uL — ABNORMAL LOW (ref 3.87–5.11)
RDW: 13.6 % (ref 11.5–15.5)
WBC: 10.2 10*3/uL (ref 4.0–10.5)
nRBC: 0 % (ref 0.0–0.2)

## 2023-07-07 MED ORDER — COCONUT OIL OIL: 1.0000 | TOPICAL_OIL | Status: DC | PRN

## 2023-07-07 MED ORDER — IBUPROFEN 600 MG PO TABS: 600.0000 mg | ORAL_TABLET | Freq: Four times a day (QID) | ORAL | 0 refills | Status: DC

## 2023-07-07 MED ORDER — DOCUSATE SODIUM 100 MG PO CAPS: 100.0000 mg | ORAL_CAPSULE | Freq: Two times a day (BID) | ORAL | Status: DC

## 2023-07-07 MED ORDER — WITCH HAZEL-GLYCERIN EX PADS: 1.0000 | MEDICATED_PAD | CUTANEOUS | 12 refills | Status: DC | PRN

## 2023-07-07 MED ORDER — SIMETHICONE 80 MG PO CHEW: 80.0000 mg | CHEWABLE_TABLET | ORAL | Status: DC | PRN

## 2023-07-07 NOTE — Lactation Note (Signed)
This note was copied from a baby's chart. Lactation Consultation Note  Patient Name: Emily Flowers Date: 07/07/2023 Age:29 hours Reason for consult: Follow-up assessment;Infant weight loss (3 % weight loss. per mom BF is going well and the 2 LC's yesterday answered all my breast feeding questions yesterday. LC reminded since she is and experienced BF her volume of milk can be greater and its ok to release the 2nd breast down with a HP.)Parents  preparing for D/C.   Maternal Data    Feeding Mother's Current Feeding Choice: Breast Milk        Discharge Discharge Education: Engorgement and breast care (per mom was explained to her yesterday and storage of BM) Pump: Manual;Personal;DEBP  Consult Status Consult Status: Complete Date: 07/07/23    Kathrin Greathouse 07/07/2023, 2:29 PM

## 2023-07-11 ENCOUNTER — Encounter: Payer: Medicaid Other | Admitting: Obstetrics & Gynecology

## 2023-07-15 DIAGNOSIS — Z00111 Health examination for newborn 8 to 28 days old: Secondary | ICD-10-CM | POA: Diagnosis not present

## 2023-07-18 ENCOUNTER — Encounter: Payer: Medicaid Other | Admitting: Obstetrics and Gynecology

## 2023-07-20 ENCOUNTER — Telehealth (HOSPITAL_COMMUNITY): Payer: Self-pay

## 2023-07-20 NOTE — Telephone Encounter (Signed)
07/20/2023 0930  Name: Emily Flowers MRN: [id number] DOB: [birth year]  Reason for Call:  Transition of Care Hospital Discharge Call  Contact Status: Patient Contact Status: Complete  Language assistant needed:          Follow-Up Questions: Do You Have Any Concerns About Your Health As You Heal From Delivery?: Yes What Concerns Do You Have About Your Health?: Patient states "I had some cramping in my lower abdomen that radiated to my but". She reports that she has a hemorrhoid also that she wonders if caused some discomfort. She states that she took some motrin and used a heating pad and the discomfort went away. She is using witch hazel pads for hemorrhoids. She asks about taking a stool softner. RN reviewed stool softer information from her D/C summary. RN reviewed uterine involution and cramping. RN told patient to contact her OB with any increased pain or if she is concerned. Patient has no other questions or concerns at this time. Do You Have Any Concerns About Your Infants Health?: No  Edinburgh Postnatal Depression Scale:  In the Past 7 Days: I have been able to laugh and see the funny side of things.: As much as I always could I have looked forward with enjoyment to things.: As much as I ever did I have blamed myself unnecessarily when things went wrong.: No, never I have been anxious or worried for no good reason.: Hardly ever I have felt scared or panicky for no good reason.: No, not at all Things have been getting on top of me.: No, I have been coping as well as ever I have been so unhappy that I have had difficulty sleeping.: Not at all I have felt sad or miserable.: Not very often I have been so unhappy that I have been crying.: No, never The thought of harming myself has occurred to me.: Never Edinburgh Postnatal Depression Scale Total: 2  PHQ2-9 Depression Scale:     Discharge Follow-up: Edinburgh score requires follow up?: No Patient was advised of the following  resources:: Breastfeeding Support Group, Support Group  Post-discharge interventions: Reviewed Newborn Safe Sleep Practices  Signature  Signe Colt

## 2023-07-23 ENCOUNTER — Ambulatory Visit (HOSPITAL_COMMUNITY): Payer: 59 | Attending: Cardiology

## 2023-07-23 DIAGNOSIS — R0609 Other forms of dyspnea: Secondary | ICD-10-CM | POA: Diagnosis not present

## 2023-07-23 LAB — ECHOCARDIOGRAM COMPLETE
AR max vel: 2.18 cm2
AV Area VTI: 2.05 cm2
AV Area mean vel: 2.09 cm2
AV Mean grad: 5 mm[Hg]
AV Peak grad: 10.2 mm[Hg]
Ao pk vel: 1.6 m/s
Area-P 1/2: 3.39 cm2
S' Lateral: 2.7 cm

## 2023-08-16 ENCOUNTER — Ambulatory Visit (INDEPENDENT_AMBULATORY_CARE_PROVIDER_SITE_OTHER): Payer: 59 | Admitting: Family Medicine

## 2023-08-16 VITALS — BP 128/89 | HR 86 | Wt 136.0 lb

## 2023-08-16 DIAGNOSIS — B379 Candidiasis, unspecified: Secondary | ICD-10-CM

## 2023-08-16 MED ORDER — FLUCONAZOLE 150 MG PO TABS: 150.0000 mg | ORAL_TABLET | Freq: Once | ORAL | 2 refills | Status: AC

## 2023-08-16 NOTE — Progress Notes (Unsigned)
Post Partum Visit Note  Emily Flowers is a 27 y.o. [phone] female who presents for a postpartum visit. She is 6 weeks postpartum following a normal spontaneous vaginal delivery.  I have fully reviewed the prenatal and intrapartum course. The delivery was at [redacted]w[redacted]d gestational weeks.  Anesthesia: none. Postpartum course has been well. Baby is doing well. Baby is feeding by breast. Bleeding no bleeding. Bowel function is normal. Bladder function is normal. Patient is not sexually active. Contraception method is none. Postpartum depression screening: negative.  [email or web address]  The pregnancy intention screening data noted above was reviewed. Potential methods of contraception were discussed. The patient elected to proceed with No data recorded.   Edinburgh Postnatal Depression Scale - 08/16/23 0944       Edinburgh Postnatal Depression Scale:  In the Past 7 Days   I have been able to laugh and see the funny side of things. 0    I have looked forward with enjoyment to things. 0    I have blamed myself unnecessarily when things went wrong. 1    I have been anxious or worried for no good reason. 2    I have felt scared or panicky for no good reason. 0    Things have been getting on top of me. 0    I have been so unhappy that I have had difficulty sleeping. 0    I have felt sad or miserable. 0    I have been so unhappy that I have been crying. 0    The thought of harming myself has occurred to me. 0    Edinburgh Postnatal Depression Scale Total 3             Health Maintenance Due  Topic Date Due   COVID-19 Vaccine (4 - 2024-25 season) 04/28/2023    The following portions of the patient's history were reviewed and updated as appropriate: allergies, current medications, past family history, past medical history, past social history, past surgical history, and problem list.  Review of Systems Pertinent items are noted in HPI.  Objective:  BP 128/89   Pulse 86   Wt  136 lb (61.7 kg)   LMP 10/10/2022 (Exact Date)   Breastfeeding Yes   BMI 21.95 kg/m    General:  {gen appearance:16600}   Breasts:  {desc; normal/abnormal/not indicated:14647}  Lungs: {lung exam:16931}  Heart:  {heart exam:5510}  Abdomen: {abdomen exam:16834}   Wound {Wound assessment:11097}  GU exam:  {desc; normal/abnormal/not indicated:14647}       Assessment:    There are no diagnoses linked to this encounter.  *** postpartum exam.   Plan:   Essential components of care per ACOG recommendations:  1.  Mood and well being: Patient with {gen negative/positive:315881} depression screening today. Reviewed local resources for support.  - Patient tobacco use? {tobacco use:25506}  - hx of drug use? {yes/no:25505}    2. Infant care and feeding:  -Patient currently breastmilk feeding? {yes/no:25502}  -Social determinants of health (SDOH) reviewed in EPIC. No concerns***The following needs were identified***  3. Sexuality, contraception and birth spacing - Patient {DOES_DOES GEX:52841} want a pregnancy in the next year.  Desired family size is {NUMBER 1-10:22536} children.  - Reviewed reproductive life planning. Reviewed contraceptive methods based on pt preferences and effectiveness.  Patient desired {Upstream End Methods:24109} today.   - Discussed birth spacing of 18 months  4. Sleep and fatigue -Encouraged family/partner/community support of 4 hrs of uninterrupted sleep to help with mood and  fatigue  5. Physical Recovery  - Discussed patients delivery and complications. She describes her labor as {description:25511} - Patient had a {CHL AMB [phone]}. Patient had a {laceration:25518} laceration. Perineal healing reviewed. Patient expressed understanding - Patient has urinary incontinence? {yes/no:25515} - Patient {ACTION; IS/IS WUJ:81191478} safe to resume physical and sexual activity  6.  Health Maintenance - HM due items addressed {Yes or If no, why  not?:20788} - Last pap smear  Diagnosis  Date Value Ref Range Status  09/18/2022   Final   - Negative for intraepithelial lesion or malignancy (NILM)   Pap smear {done:10129} at today's visit.  -Breast Cancer screening indicated? {indicated:25516}  7. Chronic Disease/Pregnancy Condition follow up: {Follow up:25499}  - PCP follow up  Federico Flake, MD [facility] for [facility], [facility]

## 2023-08-23 ENCOUNTER — Telehealth (INDEPENDENT_AMBULATORY_CARE_PROVIDER_SITE_OTHER): Payer: 59 | Admitting: Family Medicine

## 2023-08-23 ENCOUNTER — Encounter: Payer: Self-pay | Admitting: Oncology

## 2023-08-23 ENCOUNTER — Ambulatory Visit: Payer: Self-pay | Admitting: *Deleted

## 2023-08-23 DIAGNOSIS — J019 Acute sinusitis, unspecified: Secondary | ICD-10-CM | POA: Diagnosis not present

## 2023-08-23 DIAGNOSIS — H109 Unspecified conjunctivitis: Secondary | ICD-10-CM

## 2023-08-23 DIAGNOSIS — B9789 Other viral agents as the cause of diseases classified elsewhere: Secondary | ICD-10-CM

## 2023-08-23 MED ORDER — POLYMYXIN B-TRIMETHOPRIM 10000-0.1 UNIT/ML-% OP SOLN: 1.0000 [drp] | OPHTHALMIC | 0 refills | Status: DC

## 2023-08-23 NOTE — Telephone Encounter (Signed)
Reason for Disposition  [1] Very small amount of discharge AND [2] only in corner of eye  Answer Assessment - Initial Assessment Questions 1. EYE DISCHARGE: "Is the discharge in one or both eyes?" "What color is it?" "How much is there?" "When did the discharge start?"      Having left eye congestion.    Started a few days ago.   The eye started with the discharge last night.  I'm having headaches for the last 2 days.     Just nasal congestion.    2. REDNESS OF SCLERA: "Is the redness in one or both eyes?" "When did the redness start?"      A little red 3. EYELIDS: "Are the eyelids red or swollen?" If Yes, ask: "How much?"      No   4. VISION: "Is there any difficulty seeing clearly?"      No 5. PAIN: "Is there any pain? If Yes, ask: "How bad is it?" (Scale 1-10; or mild, moderate, severe)    - MILD (1-3): doesn't interfere with normal activities     - MODERATE (4-7): interferes with normal activities or awakens from sleep    - SEVERE (8-10): excruciating pain, unable to do any normal activities       It itches when the discharge is there.  No burning.  It's not a constant drainage.   It's a build up.     I think it's related to the congestion. 6. CONTACT LENS: "Do you wear contacts?"     No 7. OTHER SYMPTOMS: "Do you have any other symptoms?" (e.g., fever, runny nose, cough)     Nasal congestion. 8. PREGNANCY: "Is there any chance you are pregnant?" "When was your last menstrual period?"     Not asked  Protocols used: Eye - Pus or Discharge-A-AH  Chief Complaint: Nasal congestion and eye congestion.    She is breastfeeding so requested a video visit. Symptoms: left eye congestion and nasal congestion Frequency: "For a few days now" Pertinent Negatives: Patient denies fever, sore throat, coughing Disposition: [] ED /[] Urgent Care (no appt availability in office) / [x] Appointment(In office/virtual)/ []  [facility]/ [] Home Care/ [] Refused Recommended Disposition /[] [facility]/ []  Follow-up with PCP Additional Notes: Virtual visit appt made at pt's request for today at 1:20 with Dr. Payton Mccallum

## 2023-08-23 NOTE — Progress Notes (Unsigned)
    MyChart Video Visit    Virtual Visit via Video Note   This format is felt to be most appropriate for this patient at this time. Physical exam was limited by quality of the video and audio technology used for the visit.   Patient location: Home Provider location: [facility]  I discussed the limitations of evaluation and management by telemedicine and the availability of in person appointments. The patient expressed understanding and agreed to proceed.  Patient: Emily Flowers   DOB: [birth year]   27 y.o. Female  MRN: [id number] Visit Date: 08/23/2023  Today's healthcare provider: Sherlyn Hay, DO   No chief complaint on file.  Subjective    HPI  Presenting for left eye and nasal congestion. She is currently breast-feeding  Frontal headache  OTC - tylenol ***   Medications: Outpatient Medications Prior to Visit  Medication Sig   acetaminophen (TYLENOL) 500 MG tablet Take by mouth.   cetirizine (ZYRTEC) 10 MG chewable tablet Chew 10 mg by mouth daily.   coconut oil OIL Apply 1 Application topically as needed.   docusate sodium (COLACE) 100 MG capsule Take 1 capsule (100 mg total) by mouth 2 (two) times daily.   ibuprofen (ADVIL) 600 MG tablet Take 1 tablet (600 mg total) by mouth every 6 (six) hours.   Prenatal Vit-Fe Fumarate-FA (MULTIVITAMIN-PRENATAL) 27-0.8 MG TABS tablet Take 1 tablet by mouth daily at 12 noon.   witch hazel-glycerin (TUCKS) pad Apply 1 Application topically as needed for hemorrhoids.   No facility-administered medications prior to visit.    Review of Systems  Constitutional:  Negative for appetite change, chills, fatigue and fever.  HENT:  Positive for congestion, sinus pressure and sinus pain. Negative for ear pain, rhinorrhea, sore throat and trouble swallowing.   Eyes:  Positive for discharge (greenish) and itching.  Respiratory:  Negative for chest tightness and shortness of breath.   Cardiovascular:  Negative for chest  pain and palpitations.  Gastrointestinal:  Negative for abdominal pain, nausea and vomiting.    {Insert previous labs (optional):23779} {See past labs  Heme  Chem  Endocrine  Serology  Results Review (optional):1}   Objective    LMP 10/10/2022 (Exact Date)   {Insert last BP/Wt (optional):23777}{See vitals history (optional):1}   Physical Exam Constitutional:      General: She is not in acute distress.    Appearance: Normal appearance.  HENT:     Head: Normocephalic.  Pulmonary:     Effort: Pulmonary effort is normal. No respiratory distress.  Neurological:     Mental Status: She is alert and oriented to person, place, and time. Mental status is at baseline.       Assessment & Plan    There are no diagnoses linked to this encounter.   ***  No follow-ups on file.     I discussed the assessment and treatment plan with the patient. The patient was provided an opportunity to ask questions and all were answered. The patient agreed with the plan and demonstrated an understanding of the instructions.   The patient was advised to call back or seek an in-person evaluation if the symptoms worsen or if the condition fails to improve as anticipated.  I provided 12 minutes of virtual-face-to-face time during this encounter.   Sherlyn Hay, DO [facility] [phone] (phone) [phone] (fax)  [facility]

## 2023-08-23 NOTE — Telephone Encounter (Signed)
Message from [place] [facility] sent at 08/23/2023  8:47 AM EST  Summary: congestion/eye discharge   Pt has congestion, eye discharge, headache, and 2 small children at home that are not feeling well.  I scheduled her for what I thought was today, but was Monday, so had to call her back to tell her.  But she would like a virtual visit.          Call History  Contact Date/Time Type Contact Phone/Fax By  08/23/2023 08:43 AM EST Phone (Incoming) Lamar Benes, [place] (Self) [phone] Judie Petit) Crist Infante

## 2023-08-26 ENCOUNTER — Telehealth: Payer: Medicaid Other | Admitting: Family Medicine

## 2023-08-27 ENCOUNTER — Encounter: Payer: Self-pay | Admitting: Family Medicine

## 2023-09-18 ENCOUNTER — Encounter: Payer: Medicaid Other | Admitting: Family Medicine

## 2023-09-25 ENCOUNTER — Ambulatory Visit: Payer: Self-pay | Admitting: *Deleted

## 2023-09-25 NOTE — Telephone Encounter (Signed)
  Chief Complaint: Sore throat, swollen lymph nodes and a stomach ache that all started yesterday.   She is breastfeeding so has not taken anything except Tylenol. Symptoms: above Frequency: Started yesterday.  The stomach ache started last night Pertinent Negatives: Patient denies fever or feeling like her throat is swollen. Disposition: [] ED /[x] Urgent Care (no appt availability in office) / [] Appointment(In office/virtual)/ []  [facility]/ [] Home Care/ [] Refused Recommended Disposition /[] [facility]/ []  Follow-up with PCP Additional Notes: She has not been seen at [facility] since 10/18/2019.  She is still within the 3 yr window to still be established.   The next available appt I had with any provider was 10/01/2023.   She said,   "Never mind, I'll just go to the urgent care" and hung up.

## 2023-09-25 NOTE — Telephone Encounter (Signed)
Reason for Disposition  [1] Sore throat with cough/cold symptoms AND [2] present > 5 days  Answer Assessment - Initial Assessment Questions 1. ONSET: "When did the throat start hurting?" (Hours or days ago)      I have a sore throat, swollen lymph nodes.   And a stomach ache.   It all hit me yesterday.   I'm using Tylenol.  I'm breast feeding.  I also have sinus pressure.   No appetite.    No vomiting or diarrhea just a stomach ache.    2. SEVERITY: "How bad is the sore throat?" (Scale 1-10; mild, moderate or severe)   - MILD (1-3):  Doesn't interfere with eating or normal activities.   - MODERATE (4-7): Interferes with eating some solids and normal activities.   - SEVERE (8-10):  Excruciating pain, interferes with most normal activities.   - SEVERE WITH DYSPHAGIA (10): Can't swallow liquids, drooling.     My throat is just sore.    3. STREP EXPOSURE: "Has there been any exposure to strep within the past week?" If Yes, ask: "What type of contact occurred?"      My daughter has an ear infection.   I have pressure in both ears. 4.  VIRAL SYMPTOMS: "Are there any symptoms of a cold, such as a runny nose, cough, hoarse voice or red eyes?"      No runny nose or coughing.   5. FEVER: "Do you have a fever?" If Yes, ask: "What is your temperature, how was it measured, and when did it start?"     No fever 6. PUS ON THE TONSILS: "Is there pus on the tonsils in the back of your throat?"     I don't have tonsils.   7. OTHER SYMPTOMS: "Do you have any other symptoms?" (e.g., difficulty breathing, headache, rash)     A stomach ache.    I'm was supposed to be seen to establish but it snowed.    I had a video visit a month ago for URI symptoms. 8. PREGNANCY: "Is there any chance you are pregnant?" "When was your last menstrual period?"     No but I'm breastfeeding.  Protocols used: Sore Throat-A-AH

## 2023-09-30 ENCOUNTER — Telehealth: Payer: Self-pay | Admitting: Nurse Practitioner

## 2023-09-30 DIAGNOSIS — J0140 Acute pansinusitis, unspecified: Secondary | ICD-10-CM

## 2023-09-30 MED ORDER — AMOXICILLIN-POT CLAVULANATE 875-125 MG PO TABS: 1.0000 | ORAL_TABLET | Freq: Two times a day (BID) | ORAL | 0 refills | Status: AC

## 2023-09-30 MED ORDER — FLUTICASONE PROPIONATE 50 MCG/ACT NA SUSP: 2.0000 | Freq: Every day | NASAL | 6 refills | Status: DC

## 2023-09-30 NOTE — Progress Notes (Signed)
Virtual Visit Consent   [facility], you are scheduled for a virtual visit with a [facility] provider today. Just as with appointments in the office, your consent must be obtained to participate. Your consent will be active for this visit and any virtual visit you may have with one of our providers in the next 365 days. If you have a MyChart account, a copy of this consent can be sent to you electronically.  As this is a virtual visit, video technology does not allow for your provider to perform a traditional examination. This may limit your provider's ability to fully assess your condition. If your provider identifies any concerns that need to be evaluated in person or the need to arrange testing (such as labs, EKG, etc.), we will make arrangements to do so. Although advances in technology are sophisticated, we cannot ensure that it will always work on either your end or our end. If the connection with a video visit is poor, the visit may have to be switched to a telephone visit. With either a video or telephone visit, we are not always able to ensure that we have a secure connection.  By engaging in this virtual visit, you consent to the provision of healthcare and authorize for your insurance to be billed (if applicable) for the services provided during this visit. Depending on your insurance coverage, you may receive a charge related to this service.  I need to obtain your verbal consent now. Are you willing to proceed with your visit today? Princesa Nakita Santerre has provided verbal consent on 09/30/2023 for a virtual visit (video or telephone). Viviano Simas, FNP  Date: 09/30/2023 1:49 PM  Virtual Visit via Video Note   I, Viviano Simas, connected with  [name]  ([id number], [birth year]) on 09/30/23 at  2:00 PM EST by a video-enabled telemedicine application and verified that I am speaking with the correct person using two identifiers.  Location: Patient: Virtual Visit Location Patient:  Home Provider: Virtual Visit Location Provider: Home Office   I discussed the limitations of evaluation and management by telemedicine and the availability of in person appointments. The patient expressed understanding and agreed to proceed.    History of Present Illness: Emily Flowers is a 28 y.o. who identifies as a female who was assigned female at birth, and is being seen today for sinus pressure and drainage with productive cough   She started to feel sick initially last week  Started with sore throat, PND, some chest congestion that has progressed Noted today that her drainage is darker and green   She is currently breastfeeding  She was seen for similar symptoms one month ago   Feels she has consistently stayed congested   She is using ginger, tea, honey, tylenol    Problems:  Patient Active Problem List   Diagnosis Date Noted   Linden Dolin 07/06/2023   Anemia affecting pregnancy in third trimester 04/30/2023   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   Supervision of other normal pregnancy, antepartum 12/04/2022    Allergies:  Allergies  Allergen Reactions   Sulfa Antibiotics     Unknown childhood reaction   Medications:  Current Outpatient Medications:    acetaminophen (TYLENOL) 500 MG tablet, Take by mouth., Disp: , Rfl:    cetirizine (ZYRTEC) 10 MG chewable tablet, Chew 10 mg by mouth daily., Disp: , Rfl:    coconut oil OIL, Apply 1 Application topically as needed., Disp: , Rfl:    docusate sodium (  COLACE) 100 MG capsule, Take 1 capsule (100 mg total) by mouth 2 (two) times daily., Disp: , Rfl:    ibuprofen (ADVIL) 600 MG tablet, Take 1 tablet (600 mg total) by mouth every 6 (six) hours., Disp: 30 tablet, Rfl: 0   Prenatal Vit-Fe Fumarate-FA (MULTIVITAMIN-PRENATAL) 27-0.8 MG TABS tablet, Take 1 tablet by mouth daily at 12 noon., Disp: , Rfl:    trimethoprim-polymyxin b (POLYTRIM) ophthalmic solution, Place 1 drop into the left eye every 4 (four) hours.,  Disp: 10 mL, Rfl: 0   witch hazel-glycerin (TUCKS) pad, Apply 1 Application topically as needed for hemorrhoids., Disp: 40 each, Rfl: 12  Observations/Objective: Patient is well-developed, well-nourished in no acute distress.  Resting comfortably  at home.  Head is normocephalic, atraumatic.  No labored breathing.  Speech is clear and coherent with logical content.  Patient is alert and oriented at baseline.    Assessment and Plan:   1. Acute non-recurrent pansinusitis (Primary)  Push fluids and start flonase to keep sinuses open  May also use ocean spray nasal mist for added relief    - amoxicillin-clavulanate (AUGMENTIN) 875-125 MG tablet; Take 1 tablet by mouth 2 (two) times daily for 7 days.  Dispense: 14 tablet; Refill: 0 - fluticasone (FLONASE) 50 MCG/ACT nasal spray; Place 2 sprays into both nostrils daily.  Dispense: 16 g; Refill: 6     Follow Up Instructions: I discussed the assessment and treatment plan with the patient. The patient was provided an opportunity to ask questions and all were answered. The patient agreed with the plan and demonstrated an understanding of the instructions.  A copy of instructions were sent to the patient via MyChart unless otherwise noted below.    The patient was advised to call back or seek an in-person evaluation if the symptoms worsen or if the condition fails to improve as anticipated.    Viviano Simas, FNP

## 2023-10-01 ENCOUNTER — Encounter: Payer: Self-pay | Admitting: Oncology

## 2023-10-15 ENCOUNTER — Encounter: Payer: Self-pay | Admitting: Oncology

## 2023-10-22 ENCOUNTER — Inpatient Hospital Stay: Payer: 59 | Attending: Oncology

## 2023-10-22 ENCOUNTER — Encounter: Payer: Self-pay | Admitting: Oncology

## 2023-10-22 ENCOUNTER — Inpatient Hospital Stay (HOSPITAL_BASED_OUTPATIENT_CLINIC_OR_DEPARTMENT_OTHER): Payer: 59 | Admitting: Oncology

## 2023-10-22 VITALS — BP 106/78 | HR 58 | Temp 96.2°F | Resp 18 | Wt 128.1 lb

## 2023-10-22 DIAGNOSIS — D508 Other iron deficiency anemias: Secondary | ICD-10-CM

## 2023-10-22 DIAGNOSIS — D708 Other neutropenia: Secondary | ICD-10-CM

## 2023-10-22 DIAGNOSIS — O99013 Anemia complicating pregnancy, third trimester: Secondary | ICD-10-CM

## 2023-10-22 DIAGNOSIS — Z803 Family history of malignant neoplasm of breast: Secondary | ICD-10-CM | POA: Insufficient documentation

## 2023-10-22 DIAGNOSIS — D509 Iron deficiency anemia, unspecified: Secondary | ICD-10-CM | POA: Insufficient documentation

## 2023-10-22 DIAGNOSIS — D72819 Decreased white blood cell count, unspecified: Secondary | ICD-10-CM | POA: Diagnosis not present

## 2023-10-22 LAB — CBC
HCT: 37.4 % (ref 36.0–46.0)
Hemoglobin: 12.4 g/dL (ref 12.0–15.0)
MCH: 31.6 pg (ref 26.0–34.0)
MCHC: 33.2 g/dL (ref 30.0–36.0)
MCV: 95.4 fL (ref 80.0–100.0)
Platelets: 189 10*3/uL (ref 150–400)
RBC: 3.92 MIL/uL (ref 3.87–5.11)
RDW: 13 % (ref 11.5–15.5)
WBC: 3.2 10*3/uL — ABNORMAL LOW (ref 4.0–10.5)
nRBC: 0 % (ref 0.0–0.2)

## 2023-10-22 LAB — VITAMIN B12: Vitamin B-12: 530 pg/mL (ref 180–914)

## 2023-10-22 LAB — IRON AND TIBC
Iron: 91 ug/dL (ref 28–170)
Saturation Ratios: 28 % (ref 10.4–31.8)
TIBC: 325 ug/dL (ref 250–450)
UIBC: 234 ug/dL

## 2023-10-22 LAB — FERRITIN: Ferritin: 104 ng/mL (ref 11–307)

## 2023-10-22 NOTE — Progress Notes (Signed)
 Hematology/Oncology Consult note [facility]  [phone] [phone]  Patient Care Team: Chrismon, Jodell Cipro, PA-C (Inactive) as PCP - General (Family Medicine) Federico Flake, MD as PCP - OBGYN (Obstetrics and Gynecology) Creig Hines, MD as Consulting Physician (Oncology)   Name of the patient: Emily Flowers  [id number]  1996-02-23   Date of visit: 10/22/23  Diagnosis- 1.  Chronic neutropenia likely benign 2. History of iron deficiency anemia in pregnancy  Chief complaint/ Reason for visit-routine follow-up of neutropenia and anemia  Heme/Onc history:  patient is a 28 year old African-American female who has been referred for leukopenia/neutropenia white cell count has been mainly fluctuating between 3.5-4.5.  In 2021 her white count did go to 2.9 and then bounced back to 3.5.  Most recent CBC from 09/27/2022 showed a white cell count of 2.1 with an H&H of 12.9/37.6 and a platelet count of 185.  Patient denies any over-the-counter herbal medications.  She was recently checked for HIV hep B and hep C as well which was all negative.    Patient noted to have a hemoglobin of 10 in her third trimester of pregnancy.  She received IV iron for the same    Interval history-patient's delivery was uneventful November 2024.  She is presently feeling well and denies any complaints at this time  ECOG PS- 0 Pain scale- 0   Review of systems- Review of Systems  Constitutional:  Negative for chills, fever, malaise/fatigue and weight loss.  HENT:  Negative for congestion, ear discharge and nosebleeds.   Eyes:  Negative for blurred vision.  Respiratory:  Negative for cough, hemoptysis, sputum production, shortness of breath and wheezing.   Cardiovascular:  Negative for chest pain, palpitations, orthopnea and claudication.  Gastrointestinal:  Negative for abdominal pain, blood in stool, constipation, diarrhea, heartburn, melena, nausea and  vomiting.  Genitourinary:  Negative for dysuria, flank pain, frequency, hematuria and urgency.  Musculoskeletal:  Negative for back pain, joint pain and myalgias.  Skin:  Negative for rash.  Neurological:  Negative for dizziness, tingling, focal weakness, seizures, weakness and headaches.  Endo/Heme/Allergies:  Does not bruise/bleed easily.  Psychiatric/Behavioral:  Negative for depression and suicidal ideas. The patient does not have insomnia.       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   Urge incontinence 10/04/2022     Past Surgical History:  Procedure Laterality Date   TONSILLECTOMY Bilateral 04/08/2020   Procedure: TONSILLECTOMY;  Surgeon: Linus Salmons, MD;  Location: [facility];  Service: ENT;  Laterality: Bilateral;   TONSILLECTOMY     WISDOM TOOTH EXTRACTION      Social History   Socioeconomic History   Marital status: Married    Spouse name: Christiane Ha   Number of children: 1   Years of education: Not on file   Highest education level: Not on file  Occupational History   Not on file  Tobacco Use   Smoking status: Never    Passive exposure: Never   Smokeless tobacco: Never  Vaping Use   Vaping status: Never Used  Substance and Sexual Activity  Alcohol use: No    Alcohol/week: 0.0 standard drinks of alcohol   Drug use: No   Sexual activity: Yes    Partners: Male    Birth control/protection: Condom    Comment: last ic tuesday  Other Topics Concern   Not on file  Social History Narrative   ** Merged History Encounter **       Social Drivers of Corporate investment banker Strain: Not on file  Food Insecurity:  No Food Insecurity (07/06/2023)   Hunger Vital Sign    Worried About Running Out of Food in the Last Year: Never true    Ran Out of Food in the Last Year: Never true  Transportation Needs: No Transportation Needs (07/06/2023)   PRAPARE - Administrator, Civil Service (Medical): No    Lack of Transportation (Non-Medical): No  Physical Activity: Not on file  Stress: Not on file  Social Connections: Unknown (01/08/2022)   Received from [facility]   Social Network    Social Network: Not on file  Intimate Partner Violence: Not At Risk (07/06/2023)   Humiliation, Afraid, Rape, and Kick questionnaire    Fear of Current or Ex-Partner: No    Emotionally Abused: No    Physically Abused: No    Sexually Abused: No    Family History  Problem Relation Age of Onset   Diabetes Father    Cancer Maternal Aunt        breast   Diabetes Paternal Aunt    Diabetes Paternal Uncle      Current Outpatient Medications:    acetaminophen (TYLENOL) 500 MG tablet, Take by mouth., Disp: , Rfl:    cetirizine (ZYRTEC) 10 MG chewable tablet, Chew 10 mg by mouth daily., Disp: , Rfl:    coconut oil OIL, Apply 1 Application topically as needed., Disp: , Rfl:    docusate sodium (COLACE) 100 MG capsule, Take 1 capsule (100 mg total) by mouth 2 (two) times daily. (Patient not taking: Reported on 10/22/2023), Disp: , Rfl:    fluticasone (FLONASE) 50 MCG/ACT nasal spray, Place 2 sprays into both nostrils daily., Disp: 16 g, Rfl: 6   ibuprofen (ADVIL) 600 MG tablet, Take 1 tablet (600 mg total) by mouth every 6 (six) hours. (Patient not taking: Reported on 10/22/2023), Disp: 30 tablet, Rfl: 0   Prenatal Vit-Fe Fumarate-FA (MULTIVITAMIN-PRENATAL) 27-0.8 MG TABS tablet, Take 1 tablet by mouth daily at 12 noon., Disp: , Rfl:    trimethoprim-polymyxin b (POLYTRIM) ophthalmic solution, Place 1 drop into the left eye every 4 (four) hours. (Patient not taking: Reported on 10/22/2023), Disp: 10 mL, Rfl:  0   witch hazel-glycerin (TUCKS) pad, Apply 1 Application topically as needed for hemorrhoids., Disp: 40 each, Rfl: 12  Physical exam:  Vitals:   10/22/23 0948  BP: 106/78  Pulse: (!) 58  Resp: 18  Temp: (!) 96.2 F (35.7 C)  TempSrc: Tympanic  SpO2: 100%  Weight: 128 lb 1.6 oz (58.1 kg)   Physical Exam Cardiovascular:     Rate and Rhythm: Normal rate and regular rhythm.     Heart sounds: Normal heart sounds.  Pulmonary:     Effort: Pulmonary effort is normal.     Breath sounds: Normal breath sounds.  Skin:    General: Skin is warm and dry.  Neurological:     Mental Status: She is alert and oriented to person, place, and time.         Latest Ref Rng &  Units 09/27/2022    9:39 AM  CMP  Glucose 70 - 99 mg/dL 74   BUN 6 - 20 mg/dL 11   Creatinine 7.84 - 1.00 mg/dL 6.96   Sodium 295 - 284 mmol/L 140   Potassium 3.5 - 5.2 mmol/L 4.1   Chloride 96 - 106 mmol/L 104   CO2 20 - 29 mmol/L 21   Calcium 8.7 - 10.2 mg/dL 9.1   Total Protein 6.0 - 8.5 g/dL 7.2   Total Bilirubin 0.0 - 1.2 mg/dL 0.4   Alkaline Phos 44 - 121 IU/L 52   AST 0 - 40 IU/L 20   ALT 0 - 32 IU/L 15       Latest Ref Rng & Units 10/22/2023    9:22 AM  CBC  WBC 4.0 - 10.5 K/uL 3.2   Hemoglobin 12.0 - 15.0 g/dL 13.2   Hematocrit 44.0 - 46.0 % 37.4   Platelets 150 - 400 K/uL 189     No images are attached to the encounter.  No results found.   Assessment and plan- Patient is a 28 y.o. female here for routine follow-up of following issues:  Iron deficiency anemia: This was mainly during her pregnancy and she received IV iron at that time.  Her hemoglobin is presently normal at 12.4 with normal iron studies and she does not require any IV iron  Benign chronic leukopenia : Patient has chronic leukopenia with a white cell count that fluctuates between 3-5.  Today it is 3.2.  This can be more conservatively monitored by her primary care doctor and she does not require follow-up with me at this time    Visit Diagnosis 1. Other iron deficiency anemia   2. Leukopenia, unspecified type      Dr. Owens Shark, MD, MPH [facility] [phone] 10/22/2023 1:09 PM

## 2023-12-03 ENCOUNTER — Encounter: Payer: Self-pay | Admitting: Oncology

## 2023-12-03 ENCOUNTER — Other Ambulatory Visit: Payer: Self-pay

## 2023-12-03 ENCOUNTER — Ambulatory Visit
Admission: RE | Admit: 2023-12-03 | Discharge: 2023-12-03 | Disposition: A | Discharge status: Home / Self Care | Payer: Self-pay | Source: Ambulatory Visit

## 2023-12-03 VITALS — BP 107/74 | HR 65 | Resp 16

## 2023-12-03 DIAGNOSIS — J0110 Acute frontal sinusitis, unspecified: Secondary | ICD-10-CM

## 2023-12-03 NOTE — ED Triage Notes (Signed)
 Patient presents to [facility] for evaluation after she hit her head bending over to pick her child up out of their baby seat on the corner of the wall.  Concerned, because she says since then, she has not felt right.  She had an episode of feeling "off balance" getting into the shower yesterday.  She c/o some nausea.  Yesterday she began to have mucous build up that she states is yellow.  She has been dealing with fatigue since Friday as well.

## 2023-12-03 NOTE — Discharge Instructions (Signed)
 Do believe your symptoms today are related to your sinuses and seasonal allergies  Continue use of Flonase and Zyrtec, add on Mucinex to further assist with drainage of the sinuses  On exam there are no neurological deficits that would indicate signs of a concussion low suspicion that this is the cause of your symptoms however if brain fog becomes more prominent, dizziness becomes more prominent or headache worsens in severity please go to the nearest emergency department for immediate evaluation  You can take Tylenol and/or Ibuprofen as needed for fever reduction and pain relief.   For cough: honey 1/2 to 1 teaspoon (you can dilute the honey in water or another fluid).  You can also use guaifenesin and dextromethorphan for cough. You can use a humidifier for chest congestion and cough.  If you don't have a humidifier, you can sit in the bathroom with the hot shower running.      For sore throat: try warm salt water gargles, cepacol lozenges, throat spray, warm tea or water with lemon/honey, popsicles or ice, or OTC cold relief medicine for throat discomfort.   For congestion: take a daily anti-histamine like Zyrtec, Claritin, and a oral decongestant, such as pseudoephedrine.  You can also use Flonase 1-2 sprays in each nostril daily.   It is important to stay hydrated: drink plenty of fluids (water, gatorade/powerade/pedialyte, juices, or teas) to keep your throat moisturized and help further relieve irritation/discomfort.

## 2023-12-03 NOTE — ED Provider Notes (Signed)
 Renaldo Fiddler    CSN: [id number] Arrival date & time: 12/03/23  1159      History   Chief Complaint Chief Complaint  Patient presents with   Head Injury   URI    HPI Emily Flowers is a 28 y.o. female.   Patient presents for evaluation of intermittent headaches, bilateral ear fullness, yellow mucus expelled from the throat, swollen and tender lymph nodes and a tickle to the throat beginning 1 day ago.  Associated sinus pressure.  Had 1 occurrence of dizziness yesterday evening while in the shower, has resolved.  Experiencing a brain follow-up described as slowed processing of information.  Had a head injury that occurred 3 days ago, unsure if related, was leaning over messing with her baby when she fell forward hitting her head against the wall.  Initially had slowed speech noted by her partner which resolved shortly after.  Has attempted use of Tylenol and ice pack, taking Zyrtec and Flonase for allergies.  Currently breast-feeding.   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 Authorizing Provider  acetaminophen (TYLENOL) 500 MG tablet Take by mouth.    [provider]  cetirizine (ZYRTEC) 10 MG chewable tablet Chew 10 mg by mouth daily.    [provider]  coconut oil OIL Apply 1 Application topically as needed. 07/07/23   Wyn Forster, MD  docusate sodium (COLACE) 100 MG capsule Take 1 capsule (100 mg total) by mouth 2 (two) times daily. Patient not taking: Reported on 10/22/2023 07/07/23   Wyn Forster, MD  fluticasone [facility]) 50 MCG/ACT nasal spray Place 2 sprays into both nostrils daily. 09/30/23   Viviano Simas, FNP  ibuprofen (ADVIL) 600 MG tablet Take 1 tablet (600 mg total) by mouth every 6 (six) hours. Patient not taking: Reported on 10/22/2023 07/07/23   Wyn Forster, MD  Prenatal Vit-Fe Fumarate-FA (MULTIVITAMIN-PRENATAL) 27-0.8 MG TABS tablet Take 1 tablet by mouth daily at 12 noon.    [provider]  trimethoprim-polymyxin b (POLYTRIM) ophthalmic solution Place 1 drop into the left eye every 4 (four) hours. Patient not taking:  Reported on 10/22/2023 08/23/23   Sherlyn Hay, DO  witch hazel-glycerin (TUCKS) pad Apply 1 Application topically as needed for hemorrhoids. 07/07/23   Wyn Forster, MD    Family History Family History  Problem Relation Age of Onset   Diabetes Father    Cancer Maternal Aunt        breast   Diabetes Paternal Aunt    Diabetes Paternal Uncle     Social History Social History   Tobacco Use   Smoking status: Never    Passive exposure: Never   Smokeless tobacco: Never  Vaping Use   Vaping status: Never Used  Substance Use Topics   Alcohol use: No    Alcohol/week: 0.0 standard drinks of alcohol   Drug use: No     Allergies   Sulfa antibiotics   Review of Systems Review of Systems   Physical  Exam Triage Vital Signs ED Triage Vitals [12/03/23 1213]  Encounter Vitals Group     BP 107/74     Systolic BP Percentile      Diastolic BP Percentile      Pulse Rate 65     Resp 16     Temp      Temp src      SpO2 98 %     Weight      Height      Head Circumference      Peak Flow      Pain Score      Pain Loc      Pain Education      Exclude from Growth Chart    No data found.  Updated Vital Signs BP 107/74 (BP Location: Left Arm)   Pulse 65   Resp 16   SpO2 98%   Breastfeeding Yes   Visual Acuity Right Eye Distance:   Left Eye Distance:   Bilateral Distance:    Right Eye Near:   Left Eye Near:    Bilateral Near:     Physical Exam Constitutional:      Appearance: Normal appearance.  HENT:     Head: Normocephalic.     Right Ear: Tympanic membrane, ear canal and external ear normal.     Left Ear: Tympanic membrane, ear canal and external ear normal.     Nose: Congestion present.     Mouth/Throat:     Mouth: Mucous membranes are moist.     Pharynx: Oropharynx is clear. No oropharyngeal exudate or posterior oropharyngeal erythema.  Eyes:     Extraocular Movements: Extraocular movements intact.  Cardiovascular:     Rate and Rhythm: Normal rate and regular rhythm.     Pulses: Normal pulses.     Heart sounds: Normal heart sounds.  Pulmonary:     Effort: Pulmonary effort is normal.     Breath sounds: Normal breath sounds.  Neurological:     General: No focal deficit present.     Mental Status: She is alert and oriented to person, place, and time. Mental status is at baseline.     Cranial Nerves: No cranial nerve deficit.     Sensory: No sensory deficit.     Motor: No weakness.     Coordination: Coordination normal.     Gait: Gait normal.      UC Treatments / Results  Labs (all labs ordered are listed, but only abnormal results are displayed) Labs Reviewed - No data to display  EKG   Radiology No results found.  Procedures Procedures  (including  critical care time)  Medications Ordered in UC Medications - No data to display  Initial Impression / Assessment and Plan / UC Course  I have reviewed the triage vital signs and the nursing notes.  Pertinent labs & imaging results that were available during my care of the patient were reviewed by me and considered in my medical decision making (see chart for details).  Acute nonrecurrent frontal sinusitis  Vital signs are stable, patient in no signs of distress nontoxic-appearing, no neurological deficits on exam, not believed to be related to head injury, presentation most consistent with a sinusitis most likely related to allergies, present for 2 days, due to breast-feeding advised continued use of antihistamine and Flonase recommended additional mucolytic or decongestant, advised supportive care and advised to follow-up if symptoms continue to persist or worsen, given strict precautions for neurological symptoms to go to the nearest emergency department, verbalized understanding Final Clinical Impressions(s) / UC Diagnoses   Final diagnoses:  Acute non-recurrent frontal sinusitis     Discharge Instructions      Do believe your symptoms today are related to your sinuses and seasonal allergies  Continue use of Flonase and Zyrtec, add on Mucinex to further assist with drainage of the sinuses  On exam there are no neurological deficits that would indicate signs of a concussion low suspicion that this is the cause of your symptoms however if brain fog becomes more prominent, dizziness becomes more prominent or headache worsens in severity please go to the nearest emergency department for immediate evaluation  You can take Tylenol and/or Ibuprofen as needed for fever reduction and pain relief.   For cough: honey 1/2 to 1 teaspoon (you can dilute the honey in water or another fluid).  You can also use guaifenesin and dextromethorphan for cough. You can use a humidifier for chest  congestion and cough.  If you don't have a humidifier, you can sit in the bathroom with the hot shower running.      For sore throat: try warm salt water gargles, cepacol lozenges, throat spray, warm tea or water with lemon/honey, popsicles or ice, or OTC cold relief medicine for throat discomfort.   For congestion: take a daily anti-histamine like Zyrtec, Claritin, and a oral decongestant, such as pseudoephedrine.  You can also use Flonase 1-2 sprays in each nostril daily.   It is important to stay hydrated: drink plenty of fluids (water, gatorade/powerade/pedialyte, juices, or teas) to keep your throat moisturized and help further relieve irritation/discomfort.    ED Prescriptions   None    PDMP not reviewed this encounter.   Valinda Hoar, NP 12/03/23 1321

## 2024-01-02 ENCOUNTER — Encounter: Payer: Self-pay | Admitting: Oncology

## 2024-02-04 DIAGNOSIS — J301 Allergic rhinitis due to pollen: Secondary | ICD-10-CM | POA: Diagnosis not present

## 2024-02-04 DIAGNOSIS — J320 Chronic maxillary sinusitis: Secondary | ICD-10-CM | POA: Diagnosis not present

## 2024-02-04 DIAGNOSIS — J342 Deviated nasal septum: Secondary | ICD-10-CM | POA: Diagnosis not present

## 2024-02-11 ENCOUNTER — Encounter: Payer: Self-pay | Admitting: Oncology

## 2024-02-11 ENCOUNTER — Encounter: Payer: Self-pay | Admitting: Emergency Medicine

## 2024-02-11 ENCOUNTER — Other Ambulatory Visit: Payer: Self-pay

## 2024-02-11 ENCOUNTER — Ambulatory Visit: Admission: EM | Admit: 2024-02-11 | Discharge: 2024-02-11 | Disposition: A | Discharge status: Home / Self Care

## 2024-02-11 DIAGNOSIS — K644 Residual hemorrhoidal skin tags: Secondary | ICD-10-CM | POA: Diagnosis not present

## 2024-02-11 MED ORDER — LIDOCAINE 5 % EX OINT: 1.0000 | TOPICAL_OINTMENT | CUTANEOUS | 0 refills | Status: DC | PRN

## 2024-02-11 MED ORDER — HYDROCORTISONE ACETATE 25 MG RE SUPP: 25.0000 mg | Freq: Two times a day (BID) | RECTAL | 0 refills | Status: DC

## 2024-02-11 NOTE — ED Provider Notes (Signed)
 [facility]    CSN: [id number] Arrival date & time: 02/11/24  1125      History   Chief Complaint Chief Complaint  Patient presents with   Hemorrhoids    HPI Emily Flowers is a 28 y.o. female.   Patient presents for evaluation of a painful hemorrhoid present for 2 days.  Has had hemorrhoid in the past, typically self resolved with topical Preparation H which she is attempted use of.  Unsure of possible bleeding as she is currently menstruating.  Denies constipation but endorses hard and stools with defecation.  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   Supervision of other normal pregnancy, antepartum 12/04/2022    Past Surgical History:  Procedure Laterality Date   TONSILLECTOMY Bilateral 04/08/2020   Procedure: TONSILLECTOMY;  Surgeon: Lesly Raspberry, MD;  Location: [facility];  Service: ENT;  Laterality: Bilateral;   TONSILLECTOMY     WISDOM TOOTH EXTRACTION      OB History     Gravida  2   Para  2   Term  2   Preterm      AB      Living  2      SAB      IAB      Ectopic      Multiple  0   Live Births  2            Home Medications    Prior to Admission medications   Medication Sig Start Date End Date Taking? Authorizing Provider  clindamycin (CLEOCIN) 300 MG capsule Take 300 mg by mouth 3 (three) times daily. 02/04/24  Yes [provider]  hydrocortisone (ANUSOL-HC) 25 MG suppository Place 1 suppository (25 mg total) rectally 2 (two) times daily.  02/11/24  Yes Kimiyah Blick R, NP  lidocaine  (XYLOCAINE ) 5 % ointment Apply 1 Application topically as needed. 02/11/24  Yes Sreekar Broyhill R, NP  predniSONE (STERAPRED UNI-PAK 21 TAB) 10 MG (21) TBPK tablet Take by mouth. 02/04/24  Yes [provider]  acetaminophen  (TYLENOL ) 500 MG tablet Take by mouth.    [provider]  cetirizine (ZYRTEC) 10 MG chewable tablet Chew 10 mg by mouth daily.    [provider]  coconut oil OIL Apply 1 Application topically as needed. 07/07/23   Leveque, Alyssa, MD  docusate sodium  (COLACE) 100 MG capsule Take 1 capsule (100 mg total) by mouth 2 (two) times daily. Patient not taking: Reported on 10/22/2023 07/07/23   Leveque, Alyssa, MD  fluticasone  (FLONASE ) 50 MCG/ACT nasal spray Place 2 sprays into both nostrils daily. 09/30/23   Mardene Shake, FNP  ibuprofen  (ADVIL ) 600 MG tablet Take 1 tablet (600 mg total) by mouth every 6 (six) hours. Patient not taking: Reported on 10/22/2023 07/07/23   Leveque, Alyssa, MD  Prenatal Vit-Fe Fumarate-FA (MULTIVITAMIN-PRENATAL) 27-0.8 MG TABS tablet Take 1 tablet by mouth daily at 12 noon.    [provider]  trimethoprim -polymyxin b  (POLYTRIM ) ophthalmic solution Place 1 drop into  the left eye every 4 (four) hours. Patient not taking: Reported on 10/22/2023 08/23/23   Carlean Charter, DO  witch hazel-glycerin  (TUCKS) pad Apply 1 Application topically as needed for hemorrhoids. 07/07/23   Darrow End, MD    Family History Family History  Problem Relation Age of Onset   Diabetes Father    Cancer Maternal Aunt        breast   Diabetes Paternal Aunt    Diabetes Paternal Uncle     Social History Social History   Tobacco Use   Smoking status: Never    Passive exposure: Never   Smokeless tobacco: Never  Vaping Use   Vaping status: Never Used  Substance Use Topics   Alcohol use: No    Alcohol/week: 0.0 standard drinks of alcohol   Drug use: No     Allergies   Sulfa  antibiotics   Review of Systems Review of Systems   Physical Exam Triage Vital Signs ED Triage Vitals  Encounter Vitals Group     BP 02/11/24 1141 109/62     Girls Systolic BP Percentile --      Girls Diastolic BP Percentile --      Boys Systolic BP Percentile --      Boys Diastolic BP Percentile --      Pulse Rate 02/11/24 1141 62     Resp 02/11/24 1141 18     Temp 02/11/24 1141 98.3 F (36.8 C)     Temp Source 02/11/24 1141 Oral     SpO2 02/11/24 1141 100 %     Weight --      Height --      Head Circumference --      Peak Flow --      Pain Score 02/11/24 1138 7     Pain Loc --      Pain Education --      Exclude from Growth Chart --    No data found.  Updated Vital Signs BP 109/62 (BP Location: Left Arm)   Pulse 62   Temp 98.3 F (36.8 C) (Oral)   Resp 18   LMP 02/07/2024   SpO2 100%   Visual Acuity Right Eye Distance:   Left Eye Distance:   Bilateral Distance:    Right Eye Near:   Left Eye Near:    Bilateral Near:     Physical Exam Constitutional:      Appearance: Normal appearance.   Eyes:     Extraocular Movements: Extraocular movements intact.   Pulmonary:     Effort: Pulmonary effort is normal.  Genitourinary:    Comments: External hemorrhoid present, nonbleeding, no anal tear or fissure noted  Neurological:     Mental Status: She is alert and oriented to person, place, and time. Mental status is at baseline.      UC Treatments / Results  Labs (all labs ordered are listed, but only abnormal results are displayed) Labs Reviewed - No data to display  EKG   Radiology No results found.  Procedures Procedures (including critical care time)  Medications Ordered in UC Medications - No data to display  Initial Impression / Assessment and Plan / UC Course  I have reviewed the triage vital signs and the nursing notes.  Pertinent labs & imaging results that were available during my care of the patient were reviewed by me and  considered in my medical decision making (see chart for details).  External hemorrhoid  Recommend continued use of Preparation H, given  paper prescription for hydrocortisone suppository if continued symptoms recommended heat over the affected area and patient to initiate stool softener for reduction of hardened stools and constipation referral to general surgery for evaluation of removal, no signs of incarceration Final Clinical Impressions(s) / UC Diagnoses   Final diagnoses:  External hemorrhoid     Discharge Instructions      Your evaluated for the external hemorrhoid  You may continue use of Preparation H cream twice daily did help reduce size, if ineffective you may then attempt use of hydrocortisone suppository  You may apply lidocaine  ointment as needed to help with pain, may take Tylenol  and or Motrin  for additional comfort  May apply warm compresses or soak in warm water to help reduce size and for general comfort  If symptoms continue to persist and is painful please follow-up with the general surgeon for evaluation for removal  Please begin use of daily stool softener hard stools and constipation will irritate the hemorrhoid   ED Prescriptions     Medication Sig Dispense Auth. Provider   hydrocortisone (ANUSOL-HC) 25 MG suppository Place 1 suppository (25 mg total) rectally 2 (two) times daily. 12 suppository Wassim Kirksey R, NP   lidocaine  (XYLOCAINE ) 5 % ointment Apply 1 Application topically as needed. 35.44 g Reena Canning, NP      PDMP not reviewed this encounter.   Reena Canning, NP 02/11/24 1231

## 2024-02-11 NOTE — Discharge Instructions (Signed)
 Your evaluated for the external hemorrhoid  You may continue use of Preparation H cream twice daily did help reduce size, if ineffective you may then attempt use of hydrocortisone suppository  You may apply lidocaine  ointment as needed to help with pain, may take Tylenol  and or Motrin  for additional comfort  May apply warm compresses or soak in warm water to help reduce size and for general comfort  If symptoms continue to persist and is painful please follow-up with the general surgeon for evaluation for removal  Please begin use of daily stool softener hard stools and constipation will irritate the hemorrhoid

## 2024-02-11 NOTE — ED Triage Notes (Signed)
 Patient has a history of hemorrhoids.  Patient thinks this is the issue today.  Symptoms started 2 days ago.  Patient has issues with having regular bowel movements, denies constipation.  Patient has intermittent  hard stools   Has used prep h.

## 2024-02-25 DIAGNOSIS — J301 Allergic rhinitis due to pollen: Secondary | ICD-10-CM | POA: Diagnosis not present

## 2024-02-25 DIAGNOSIS — J320 Chronic maxillary sinusitis: Secondary | ICD-10-CM | POA: Diagnosis not present

## 2024-04-20 ENCOUNTER — Other Ambulatory Visit: Payer: 59

## 2024-04-20 ENCOUNTER — Ambulatory Visit: Payer: 59 | Admitting: Oncology

## 2024-07-08 ENCOUNTER — Ambulatory Visit (INDEPENDENT_AMBULATORY_CARE_PROVIDER_SITE_OTHER): Admitting: Certified Nurse Midwife

## 2024-07-08 ENCOUNTER — Encounter: Payer: Self-pay | Admitting: Certified Nurse Midwife

## 2024-07-08 VITALS — BP 111/67 | HR 82 | Ht 66.0 in | Wt 129.4 lb

## 2024-07-08 DIAGNOSIS — Z789 Other specified health status: Secondary | ICD-10-CM

## 2024-07-08 DIAGNOSIS — Z01419 Encounter for gynecological examination (general) (routine) without abnormal findings: Secondary | ICD-10-CM

## 2024-07-08 DIAGNOSIS — D509 Iron deficiency anemia, unspecified: Secondary | ICD-10-CM | POA: Diagnosis not present

## 2024-07-08 DIAGNOSIS — R102 Pelvic and perineal pain unspecified side: Secondary | ICD-10-CM | POA: Diagnosis not present

## 2024-07-08 NOTE — Progress Notes (Signed)
 GYNECOLOGY OFFICE VISIT NOTE  History:   Emily Flowers is a 28 y.o. [phone] here today for annual well woman visit. She currently endorses regular cycles lasting about 7 days. She reports heavier since her last baby and passing clots. She currently uses pads as her collection method. She uses condoms for contraception, but reports that she may possibly be pregnant. She also reports intermittent pelvic pain and discomfort since becoming less active. She denies any abnormal vaginal discharge, bleeding, or other concerns.     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   Urge incontinence 10/04/2022    Past Surgical History:  Procedure Laterality Date   TONSILLECTOMY Bilateral 04/08/2020   Procedure: TONSILLECTOMY;  Surgeon: Herminio Miu, MD;  Location: [facility];  Service: ENT;  Laterality: Bilateral;   TONSILLECTOMY     WISDOM TOOTH EXTRACTION      The following portions of the patient's history were reviewed and updated as appropriate: allergies, current medications, past family history, past medical history, past social history, past surgical history and problem list.   Health Maintenance:  Normal pap and negative HRHPV on 09/18/22.  Review of Systems:  Pertinent items noted in HPI and remainder of comprehensive ROS otherwise negative.  Physical Exam:  BP 111/67   Pulse 82   Ht 5' 6 (1.676 m)   Wt 129 lb 6 oz (58.7 kg)   BMI 20.88 kg/m  CONSTITUTIONAL: Well-developed, well-nourished female in no acute distress.  HEENT:  Normocephalic, atraumatic. External right and left ear normal. No scleral icterus.  NECK: Normal range of motion, supple, no masses noted on observation SKIN: No rash noted. Not diaphoretic. No erythema.  No pallor. MUSCULOSKELETAL: Normal range of motion. No edema noted. NEUROLOGIC: Alert and oriented to person, place, and time. Normal muscle tone coordination. No cranial nerve deficit noted. PSYCHIATRIC: Normal mood and affect. Normal behavior. Normal judgment and thought content. CARDIOVASCULAR: Normal heart rate noted RESPIRATORY: Effort and breath sounds normal, no problems with respiration noted ABDOMEN: No masses noted. No other overt distention noted.   PELVIC: Deferred  Labs and Imaging No results found for this or any previous visit (from the past week). No results found.    Assessment and Plan:    1. Well woman exam with routine gynecological exam (Primary) - Patient overall doing well.   2. Uses condoms - Offered to test for pregnancy today however patient desires to wait until the period is missed. Reports that it starts in 8 days.   3. Iron  deficiency anemia, unspecified iron  deficiency anemia type - Anemia panel collected due to increase in heavier periods.  - CBC - Vitamin D  (25 hydroxy) - B12 - Iron  and TIBC - Ferritin - Iron  and TIBC - Ferritin   Routine preventative health maintenance measures emphasized. Please refer to After Visit Summary for other counseling recommendations.   Return in about 1 year (around 07/08/2025) for [place].    I spent 30 minutes dedicated to the care of this patient including pre-visit review of records, face to face time with the patient discussing her conditions and treatments and post visit orders.    Bryona Foxworthy Erven) Emilio, MSN, CNM  [facility]  07/08/24 9:02 AM

## 2024-07-09 LAB — CBC
Hematocrit: 42.1 % (ref 34.0–46.6)
Hemoglobin: 13.4 g/dL (ref 11.1–15.9)
MCH: 30.9 pg (ref 26.6–33.0)
MCHC: 31.8 g/dL (ref 31.5–35.7)
MCV: 97 fL (ref 79–97)
Platelets: 207 x10E3/uL (ref 150–450)
RBC: 4.34 x10E6/uL (ref 3.77–5.28)
RDW: 12.2 % (ref 11.7–15.4)
WBC: 3.8 x10E3/uL (ref 3.4–10.8)

## 2024-07-09 LAB — VITAMIN D 25 HYDROXY (VIT D DEFICIENCY, FRACTURES): Vit D, 25-Hydroxy: 16.6 ng/mL — ABNORMAL LOW (ref 30.0–100.0)

## 2024-07-09 LAB — FERRITIN: Ferritin: 59 ng/mL (ref 15–150)

## 2024-07-09 LAB — IRON AND TIBC
Iron Saturation: 29 % (ref 15–55)
Iron: 89 ug/dL (ref 27–159)
Total Iron Binding Capacity: 310 ug/dL (ref 250–450)
UIBC: 221 ug/dL (ref 131–425)

## 2024-07-09 LAB — VITAMIN B12: Vitamin B-12: 873 pg/mL (ref 232–1245)

## 2024-07-11 ENCOUNTER — Ambulatory Visit: Payer: Self-pay | Admitting: Certified Nurse Midwife

## 2024-07-11 DIAGNOSIS — E559 Vitamin D deficiency, unspecified: Secondary | ICD-10-CM

## 2024-07-11 MED ORDER — VITAMIN D (ERGOCALCIFEROL) 1.25 MG (50000 UNIT) PO CAPS: 50000.0000 [IU] | ORAL_CAPSULE | ORAL | 0 refills | Status: AC

## 2024-08-26 ENCOUNTER — Ambulatory Visit
Admission: EM | Admit: 2024-08-26 | Discharge: 2024-08-26 | Disposition: A | Discharge status: Home / Self Care | Attending: Emergency Medicine | Admitting: Emergency Medicine

## 2024-08-26 DIAGNOSIS — B349 Viral infection, unspecified: Secondary | ICD-10-CM

## 2024-08-26 LAB — POCT INFLUENZA A/B
Influenza A, POC: NEGATIVE
Influenza B, POC: NEGATIVE

## 2024-08-26 NOTE — ED Provider Notes (Signed)
 " [facility]    CSN: [id number] Arrival date & time: 08/26/24  0803      History   Chief Complaint Chief Complaint  Patient presents with   Headache   Sore Throat   Chills    HPI Emily Flowers is a 28 y.o. female.  Patient presents with chills, body aches, fatigue, headache, sore throat since yesterday.  Tylenol  taken last night; no OTC medications today.  No fever, cough, shortness of breath, vomiting, diarrhea.  The history is provided by the patient and medical records.    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   Supervision of other normal pregnancy, antepartum 12/04/2022    Past Surgical History:  Procedure Laterality Date   TONSILLECTOMY Bilateral 04/08/2020   Procedure: TONSILLECTOMY;  Surgeon: Herminio Miu, MD;  Location: [facility];  Service: ENT;  Laterality: Bilateral;   TONSILLECTOMY     WISDOM TOOTH EXTRACTION      OB History     Gravida  2   Para  2   Term  2   Preterm      AB      Living  2      SAB      IAB      Ectopic      Multiple  0   Live Births  2            Home Medications    Prior to Admission medications  Medication Sig Start Date End Date Taking? Authorizing Provider  acetaminophen  (TYLENOL ) 500 MG tablet Take by mouth. Patient not taking: Reported on 07/08/2024    [provider]  cetirizine (ZYRTEC) 10 MG chewable tablet Chew 10 mg by mouth daily. Patient not taking: Reported on 07/08/2024    [provider]  clindamycin (CLEOCIN) 300 MG capsule Take 300 mg by mouth 3 (three) times daily. Patient not taking: Reported on 07/08/2024 02/04/24   [provider]  coconut oil OIL Apply 1 Application topically as needed. Patient not taking: Reported on 07/08/2024 07/07/23   Leveque, Alyssa, MD  docusate sodium  (COLACE) 100 MG capsule Take 1 capsule (100 mg total) by mouth 2 (two) times daily. Patient not taking: Reported on 07/08/2024 07/07/23   Leveque, Alyssa, MD  fluticasone  (FLONASE ) 50 MCG/ACT nasal spray Place 2 sprays into both nostrils daily. Patient not taking: Reported on 07/08/2024 09/30/23   Kennyth Domino, FNP  hydrocortisone  (ANUSOL -HC) 25 MG suppository Place 1 suppository (25 mg total) rectally 2 (two) times daily. Patient not taking: Reported on 07/08/2024 02/11/24   Teresa Shelba SAUNDERS, NP  ibuprofen  (ADVIL ) 600 MG tablet Take 1 tablet (600 mg total) by mouth every 6 (six) hours. Patient not taking: Reported on 07/08/2024 07/07/23   Leveque, Alyssa, MD  lidocaine  (XYLOCAINE ) 5 % ointment Apply 1 Application topically as needed. Patient not taking: Reported on 07/08/2024 02/11/24   Teresa Shelba SAUNDERS, NP  predniSONE (STERAPRED UNI-PAK 21 TAB) 10 MG (21) TBPK tablet Take by mouth. Patient  not taking: Reported on 07/08/2024 02/04/24   [provider]  Prenatal Vit-Fe Fumarate-FA (MULTIVITAMIN-PRENATAL) 27-0.8 MG TABS tablet Take 1 tablet by mouth daily at 12 noon. Patient not taking: Reported on 07/08/2024    [provider]  trimethoprim -polymyxin b  (POLYTRIM ) ophthalmic solution Place 1 drop into the left eye every 4 (four) hours. Patient not taking: Reported on 07/08/2024 08/23/23   Donzella Lauraine SAILOR, DO  Vitamin D , Ergocalciferol , (DRISDOL ) 1.25 MG (50000 UNIT) CAPS capsule Take 1 capsule (50,000 Units total) by mouth every 7 (seven) days. Patient not taking: Reported on 08/26/2024 07/11/24   Emilio Delilah HERO, CNM  witch hazel-glycerin  (TUCKS) pad Apply 1  Application topically as needed for hemorrhoids. Patient not taking: Reported on 07/08/2024 07/07/23   Loyola Fish, MD    Family History Family History  Problem Relation Age of Onset   Diabetes Father    Cancer Maternal Aunt        breast   Diabetes Paternal Aunt    Diabetes Paternal Uncle     Social History Social History[1]   Allergies   Sulfa antibiotics   Review of Systems Review of Systems  Constitutional:  Positive for chills and fatigue. Negative for fever.  HENT:  Positive for sore throat. Negative for congestion and ear pain.   Respiratory:  Negative for cough and shortness of breath.      Physical Exam Triage Vital Signs ED Triage Vitals  Encounter Vitals Group     BP      Girls Systolic BP Percentile      Girls Diastolic BP Percentile      Boys Systolic BP Percentile      Boys Diastolic BP Percentile      Pulse      Resp      Temp      Temp src      SpO2      Weight      Height      Head Circumference      Peak Flow      Pain Score      Pain Loc      Pain Education      Exclude from Growth Chart    No data found.  Updated Vital Signs BP 117/83   Pulse 78   Temp 99.5 F (37.5 C)   Resp 18   LMP 08/15/2024   SpO2 98%   Breastfeeding No   Visual Acuity Right Eye Distance:   Left Eye Distance:   Bilateral Distance:    Right Eye Near:   Left Eye Near:    Bilateral Near:     Physical Exam Constitutional:      General: She is not in acute distress. HENT:     Right Ear: Tympanic membrane normal.     Left Ear: Tympanic membrane normal.     Nose: Nose normal.     Mouth/Throat:     Mouth: Mucous membranes are moist.     Pharynx: Oropharynx is clear.  Cardiovascular:     Rate and Rhythm: Normal rate and regular rhythm.     Heart sounds: Normal heart sounds.  Pulmonary:     Effort: Pulmonary effort is normal. No respiratory distress.     Breath sounds: Normal breath sounds.  Neurological:     Mental Status: She is alert.       UC Treatments / Results  Labs (all labs ordered are listed, but only abnormal results are displayed) Labs Reviewed  POCT  INFLUENZA A/B - Normal    EKG   Radiology No results found.  Procedures Procedures (including critical care time)  Medications Ordered in UC Medications - No data to display  Initial Impression / Assessment and Plan / UC Course  I have reviewed the triage vital signs and the nursing notes.  Pertinent labs & imaging results that were available during my care of the patient were reviewed by me and considered in my medical decision making (see chart for details).    Viral illness.  Rapid flu negative.  Discussed symptomatic treatment including Tylenol  or ibuprofen  as needed for fever or discomfort, plain Mucinex as needed for congestion, rest, hydration.  Instructed patient to follow-up with her PCP if not improving.  ED precautions given.  Education provided on viral illness.  Patient agrees to plan of care.    Final Clinical Impressions(s) / UC Diagnoses   Final diagnoses:  Viral illness     Discharge Instructions      The flu test is negative.   Take Tylenol  or ibuprofen  as needed for fever or discomfort.  Take plain Mucinex as needed for congestion.  Rest and keep yourself hydrated.    Follow-up with your primary care provider if your symptoms are not improving.         ED Prescriptions   None    PDMP not reviewed this encounter.    [1]  Social History Tobacco Use   Smoking status: Never    Passive exposure: Never   Smokeless tobacco: Never  Vaping Use   Vaping status: Never Used  Substance Use Topics   Alcohol use: No    Alcohol/week: 0.0 standard drinks of alcohol   Drug use: No     Corlis Burnard DEL, NP 08/26/24 [phone]  "

## 2024-08-26 NOTE — ED Triage Notes (Signed)
 Patient to Urgent Care with complaints of  body aches/ chills/ headaches/ sore throat.   Symptoms started yesterday.  Taking vitamins/ tylenol 

## 2024-08-26 NOTE — Discharge Instructions (Addendum)
 The flu test is negative.   Take Tylenol or ibuprofen as needed for fever or discomfort.  Take plain Mucinex as needed for congestion.  Rest and keep yourself hydrated.    Follow-up with your primary care provider if your symptoms are not improving.

## 2024-08-28 ENCOUNTER — Encounter: Payer: Self-pay | Admitting: Oncology

## 2024-08-28 NOTE — Telephone Encounter (Signed)
 Patient should get in touch with her primary care doctor for her issues.  She had labs in November 2025 which were unremarkable

## 2024-08-28 NOTE — Telephone Encounter (Signed)
 Per Dr. Melanee Patient should get in touch with her primary care doctor for her issues.  She had labs in November 2025 which were unremarkable.  Outbound call to patient; informed of above.  Patient verbalized understanding.

## 2024-09-01 ENCOUNTER — Ambulatory Visit (INDEPENDENT_AMBULATORY_CARE_PROVIDER_SITE_OTHER)

## 2024-09-01 VITALS — BP 97/70 | HR 81 | Temp 97.7°F | Wt 124.0 lb

## 2024-09-01 DIAGNOSIS — L219 Seborrheic dermatitis, unspecified: Secondary | ICD-10-CM | POA: Insufficient documentation

## 2024-09-01 DIAGNOSIS — Z136 Encounter for screening for cardiovascular disorders: Secondary | ICD-10-CM | POA: Diagnosis not present

## 2024-09-01 DIAGNOSIS — R5383 Other fatigue: Secondary | ICD-10-CM | POA: Insufficient documentation

## 2024-09-01 LAB — POCT URINE PREGNANCY: Preg Test, Ur: NEGATIVE

## 2024-09-01 MED ORDER — KETOCONAZOLE 2 % EX SHAM: 1.0000 | MEDICATED_SHAMPOO | CUTANEOUS | 0 refills | Status: DC

## 2024-09-01 MED ORDER — KETOCONAZOLE 2 % EX SHAM: 1.0000 | MEDICATED_SHAMPOO | CUTANEOUS | 0 refills | Status: AC

## 2024-09-01 NOTE — Progress Notes (Signed)
 "     Acute visit   Patient: Emily Flowers   DOB: 06-24-1996   28 y.o. Female  MRN: [id number] PCP: Franchot Isaiah LABOR, MD   Chief Complaint  Patient presents with   Weight Check    Loss of appetite Vitaming D level low Some fatigue   Subjective    Discussed the use of AI scribe software for clinical note transcription with the patient, who gave verbal consent to proceed.  History of Present Illness Emily Flowers is a 29 year old female who presents with fatigue and weight loss. She is accompanied by her husband and son.  She has been experiencing significant fatigue and weight loss, which she describes as fluctuating, with symptoms more pronounced since June 02, 2025. Her history includes pregnancies close together, with her youngest child being one year old. Post-pregnancy, she has noticed fluctuations in her weight and energy levels.  She has changes in her hair, including patches resembling psoriasis, which worsen when she is ill. She previously consulted dermatology and was diagnosed with a topical condition. She suspects an internal issue, possibly related to her thyroid . She has had B12 injections and iron  supplementation in the past, with normal blood work at that time, but continues to experience symptoms.  Her appetite is inconsistent, contributing to her weight loss. She often skips breakfast and eats a late lunch and dinner. From July to 06-02-25, she was more consistent with her diet and exercise, resulting in a weight gain of about ten pounds.  She completed a course of vitamin D  supplementation, which improved her fatigue, though she still experiences manageable tiredness. She is currently taking prenatal vitamins that include iron  and vitamin D .  Her family history is significant for diabetes on her father's side, with her father and his siblings affected. She has noticed slight increases in thirst and urination, which she attributes to inadequate water intake.  Her  sleep is disrupted, partly due to her children's sleep patterns and recent regressions. She is trying to improve her sleep by going to bed at a reasonable time.   Review of systems as noted in HPI.   Objective    BP 97/70   Pulse 81   Temp 97.7 F (36.5 C) (Oral)   Wt 124 lb (56.2 kg)   LMP 08/15/2024   Breastfeeding No   BMI 20.01 kg/m  Physical Exam Constitutional:      Appearance: Normal appearance.  HENT:     Head: Normocephalic and atraumatic.     Comments: Scalp: dandruff and dry patches present    Mouth/Throat:     Mouth: Mucous membranes are moist.  Eyes:     Pupils: Pupils are equal, round, and reactive to light.  Pulmonary:     Effort: Pulmonary effort is normal.  Skin:    General: Skin is warm.  Neurological:     General: No focal deficit present.     Mental Status: She is alert.       Wt Readings from Last 3 Encounters:  09/01/24 124 lb (56.2 kg)  07/08/24 129 lb 6 oz (58.7 kg)  10/22/23 128 lb 1.6 oz (58.1 kg)    Results for orders placed or performed in visit on 09/01/24  POCT urine pregnancy  Result Value Ref Range   Preg Test, Ur Negative Negative    Assessment & Plan     Problem List Items Addressed This Visit       Musculoskeletal and Integument   Seborrheic dermatitis  Relevant Medications   ketoconazole  (NIZORAL ) 2 % shampoo     Other   Other fatigue - Primary   Relevant Orders   Comprehensive metabolic panel with GFR   CBC with Differential/Platelet   Hemoglobin A1c   VITAMIN D  25 Hydroxy (Vit-D Deficiency, Fractures)   Iron , TIBC and Ferritin Panel   TSH + free T4   POCT urine pregnancy (Completed)   Other Visit Diagnoses       Screening for cardiovascular condition       Relevant Orders   Lipid panel      Assessment & Plan Fatigue Patient with ~4 month hx of fatigue, low appetite, and weight loss. Differential diagnosis includes thyroid  dysfunction, vitamin deficiencies, iron  deficiency, electrolyte abnormality,  new DM. Urine pregnancy negative today.  - Ordered blood tests: TSH, vitamin D , iron , electrolytes, kidney function, liver function, cholesterol, blood sugar, A1c. - Advised on balanced diet and regular meal schedule, including breakfast. - Encouraged regular physical activity. - Discussed sleep hygiene techniques - Will follow up results with patient via MyChart  Seborrheic dermatitis Patient with persistent dandruff and dry patches on scalp. - Prescribed 2% ketoconazole  shampoo weekly until resolved   Meds ordered this encounter  Medications   DISCONTD: ketoconazole  (NIZORAL ) 2 % shampoo    Sig: Apply 1 Application topically 2 (two) times a week.    Dispense:  240 mL    Refill:  0   ketoconazole  (NIZORAL ) 2 % shampoo    Sig: Apply 1 Application topically 2 (two) times a week.    Dispense:  120 mL    Refill:  0     No follow-ups on file.      Isaiah DELENA Pepper, MD  [facility] [phone] (phone) [phone] (fax) "

## 2024-09-02 ENCOUNTER — Ambulatory Visit: Payer: Self-pay

## 2024-09-02 LAB — CBC WITH DIFFERENTIAL/PLATELET
Basophils Absolute: 0 x10E3/uL (ref 0.0–0.2)
Basos: 1 %
EOS (ABSOLUTE): 0 x10E3/uL (ref 0.0–0.4)
Eos: 1 %
Hematocrit: 41 % (ref 34.0–46.6)
Hemoglobin: 13.4 g/dL (ref 11.1–15.9)
Immature Grans (Abs): 0 x10E3/uL (ref 0.0–0.1)
Immature Granulocytes: 0 %
Lymphocytes Absolute: 1.4 x10E3/uL (ref 0.7–3.1)
Lymphs: 52 %
MCH: 30.9 pg (ref 26.6–33.0)
MCHC: 32.7 g/dL (ref 31.5–35.7)
MCV: 95 fL (ref 79–97)
Monocytes Absolute: 0.4 x10E3/uL (ref 0.1–0.9)
Monocytes: 15 %
Neutrophils Absolute: 0.9 x10E3/uL — ABNORMAL LOW (ref 1.4–7.0)
Neutrophils: 31 %
Platelets: 216 x10E3/uL (ref 150–450)
RBC: 4.34 x10E6/uL (ref 3.77–5.28)
RDW: 12.8 % (ref 11.7–15.4)
WBC: 2.8 x10E3/uL — ABNORMAL LOW (ref 3.4–10.8)

## 2024-09-02 LAB — COMPREHENSIVE METABOLIC PANEL WITH GFR
ALT: 15 IU/L (ref 0–32)
AST: 19 IU/L (ref 0–40)
Albumin: 4.6 g/dL (ref 4.0–5.0)
Alkaline Phosphatase: 44 IU/L (ref 41–116)
BUN/Creatinine Ratio: 10 (ref 9–23)
BUN: 9 mg/dL (ref 6–20)
Bilirubin Total: 0.7 mg/dL (ref 0.0–1.2)
CO2: 21 mmol/L (ref 20–29)
Calcium: 9.3 mg/dL (ref 8.7–10.2)
Chloride: 102 mmol/L (ref 96–106)
Creatinine, Ser: 0.89 mg/dL (ref 0.57–1.00)
Globulin, Total: 2.5 g/dL (ref 1.5–4.5)
Glucose: 67 mg/dL — ABNORMAL LOW (ref 70–99)
Potassium: 4.2 mmol/L (ref 3.5–5.2)
Sodium: 139 mmol/L (ref 134–144)
Total Protein: 7.1 g/dL (ref 6.0–8.5)
eGFR: 89 mL/min/1.73

## 2024-09-02 LAB — LIPID PANEL
Chol/HDL Ratio: 2.6 ratio (ref 0.0–4.4)
Cholesterol, Total: 151 mg/dL (ref 100–199)
HDL: 57 mg/dL
LDL Chol Calc (NIH): 82 mg/dL (ref 0–99)
Triglycerides: 57 mg/dL (ref 0–149)
VLDL Cholesterol Cal: 12 mg/dL (ref 5–40)

## 2024-09-02 LAB — IRON,TIBC AND FERRITIN PANEL
Ferritin: 39 ng/mL (ref 15–150)
Iron Saturation: 23 % (ref 15–55)
Iron: 74 ug/dL (ref 27–159)
Total Iron Binding Capacity: 318 ug/dL (ref 250–450)
UIBC: 244 ug/dL (ref 131–425)

## 2024-09-02 LAB — VITAMIN D 25 HYDROXY (VIT D DEFICIENCY, FRACTURES): Vit D, 25-Hydroxy: 51.8 ng/mL (ref 30.0–100.0)

## 2024-09-02 LAB — TSH+FREE T4
Free T4: 1.32 ng/dL (ref 0.82–1.77)
TSH: 1.03 u[IU]/mL (ref 0.450–4.500)

## 2024-09-02 LAB — HEMOGLOBIN A1C
Est. average glucose Bld gHb Est-mCnc: 105 mg/dL
Hgb A1c MFr Bld: 5.3 % (ref 4.8–5.6)
